# Patient Record
Sex: Male | Born: 1970 | Race: Black or African American | Hispanic: Refuse to answer | Marital: Married | Smoking: Never smoker
Health system: Southern US, Community
[De-identification: ages and names within clinical notes are randomized; demographics above are authoritative.]

## PROBLEM LIST (undated history)

## (undated) DIAGNOSIS — R3915 Urgency of urination: Secondary | ICD-10-CM

## (undated) DIAGNOSIS — M1990 Unspecified osteoarthritis, unspecified site: Secondary | ICD-10-CM

## (undated) DIAGNOSIS — T7840XA Allergy, unspecified, initial encounter: Secondary | ICD-10-CM

## (undated) DIAGNOSIS — IMO0001 Reserved for inherently not codable concepts without codable children: Secondary | ICD-10-CM

## (undated) DIAGNOSIS — E119 Type 2 diabetes mellitus without complications: Secondary | ICD-10-CM

## (undated) DIAGNOSIS — I509 Heart failure, unspecified: Secondary | ICD-10-CM

## (undated) HISTORY — PX: KNEE ARTHROSCOPY: SUR90

## (undated) HISTORY — PX: HERNIA REPAIR: SHX51

## (undated) HISTORY — PX: WRIST SURGERY: SHX841

## (undated) HISTORY — DX: Allergy, unspecified, initial encounter: T78.40XA

## (undated) HISTORY — PX: ROTATOR CUFF REPAIR: SHX139

## (undated) HISTORY — DX: Heart failure, unspecified: I50.9

---

## 2009-01-10 HISTORY — PX: GASTRIC BYPASS: SHX52

## 2013-04-08 ENCOUNTER — Emergency Department (INDEPENDENT_AMBULATORY_CARE_PROVIDER_SITE_OTHER)
Admission: EM | Admit: 2013-04-08 | Discharge: 2013-04-08 | Disposition: A | Discharge status: Home / Self Care | Payer: 59 | Source: Home / Self Care | Attending: Emergency Medicine | Admitting: Emergency Medicine

## 2013-04-08 ENCOUNTER — Encounter (HOSPITAL_COMMUNITY): Payer: Self-pay | Admitting: Emergency Medicine

## 2013-04-08 ENCOUNTER — Emergency Department (INDEPENDENT_AMBULATORY_CARE_PROVIDER_SITE_OTHER): Payer: 59

## 2013-04-08 DIAGNOSIS — J40 Bronchitis, not specified as acute or chronic: Secondary | ICD-10-CM

## 2013-04-08 DIAGNOSIS — R6889 Other general symptoms and signs: Secondary | ICD-10-CM

## 2013-04-08 HISTORY — DX: Type 2 diabetes mellitus without complications: E11.9

## 2013-04-08 LAB — POCT URINALYSIS DIP (DEVICE)
Bilirubin Urine: NEGATIVE
Glucose, UA: NEGATIVE mg/dL
Hgb urine dipstick: NEGATIVE
KETONES UR: NEGATIVE mg/dL
LEUKOCYTES UA: NEGATIVE
Nitrite: NEGATIVE
PROTEIN: 30 mg/dL — AB
Specific Gravity, Urine: >=1.03 (ref 1.005–1.030)
Urobilinogen, UA: 0.2 mg/dL (ref 0.0–1.0)
pH: 5.5 (ref 5.0–8.0)

## 2013-04-08 MED ORDER — ALBUTEROL SULFATE HFA 108 (90 BASE) MCG/ACT IN AERS: 2.0000 | INHALATION_SPRAY | RESPIRATORY_TRACT | Status: DC | PRN

## 2013-04-08 MED ORDER — PREDNISONE 50 MG PO TABS: 50.0000 mg | ORAL_TABLET | Freq: Every day | ORAL | Status: DC

## 2013-04-08 MED ORDER — HYDROCOD POLST-CHLORPHEN POLST 10-8 MG/5ML PO LQCR: 5.0000 mL | Freq: Two times a day (BID) | ORAL | Status: DC | PRN

## 2013-04-08 NOTE — ED Provider Notes (Signed)
Medical screening examination/treatment/procedure(s) were performed by non-physician practitioner and as supervising physician I was immediately available for consultation/collaboration.  Leslee Homeavid Nazarene Bunning, M.D.  Reuben Likesavid C Slyvester Latona, MD 04/08/13 [phone]

## 2013-04-08 NOTE — ED Notes (Signed)
C/o cold sx states sx started on Saturday   States he is having body aches, diarrhea, coughing, chills/hot  Did rest and took OTC medications   Denies nausea and vomiting

## 2013-04-08 NOTE — Discharge Instructions (Signed)
°Acute Bronchitis °Bronchitis is inflammation of the airways that extend from the windpipe into the lungs (bronchi). The inflammation often causes mucus to develop. This leads to a cough, which is the most common symptom of bronchitis.  °In acute bronchitis, the condition usually develops suddenly and goes away over time, usually in a couple weeks. Smoking, allergies, and asthma can make bronchitis worse. Repeated episodes of bronchitis may cause further lung problems.  °CAUSES °Acute bronchitis is most often caused by the same virus that causes a cold. The virus can spread from person to person (contagious).  °SIGNS AND SYMPTOMS  °· Cough.   °· Fever.   °· Coughing up mucus.   °· Body aches.   °· Chest congestion.   °· Chills.   °· Shortness of breath.   °· Sore throat.   °DIAGNOSIS  °Acute bronchitis is usually diagnosed through a physical exam. Tests, such as chest X-rays, are sometimes done to rule out other conditions.  °TREATMENT  °Acute bronchitis usually goes away in a couple weeks. Often times, no medical treatment is necessary. Medicines are sometimes given for relief of fever or cough. Antibiotics are usually not needed but may be prescribed in certain situations. In some cases, an inhaler may be recommended to help reduce shortness of breath and control the cough. A cool mist vaporizer may also be used to help thin bronchial secretions and make it easier to clear the chest.  °HOME CARE INSTRUCTIONS °· Get plenty of rest.   °· Drink enough fluids to keep your urine clear or pale yellow (unless you have a medical condition that requires fluid restriction). Increasing fluids may help thin your secretions and will prevent dehydration.   °· Only take over-the-counter or prescription medicines as directed by your health care provider.   °· Avoid smoking and secondhand smoke. Exposure to cigarette smoke or irritating chemicals will make bronchitis worse. If you are a smoker, consider using nicotine gum or skin  patches to help control withdrawal symptoms. Quitting smoking will help your lungs heal faster.   °· Reduce the chances of another bout of acute bronchitis by washing your hands frequently, avoiding people with cold symptoms, and trying not to touch your hands to your mouth, nose, or eyes.   °· Follow up with your health care provider as directed.   °SEEK MEDICAL CARE IF: °Your symptoms do not improve after 1 week of treatment.  °SEEK IMMEDIATE MEDICAL CARE IF: °· You develop an increased fever or chills.   °· You have chest pain.   °· You have severe shortness of breath. °· You have bloody sputum.   °· You develop dehydration. °· You develop fainting. °· You develop repeated vomiting. °· You develop a severe headache. °MAKE SURE YOU:  °· Understand these instructions. °· Will watch your condition. °· Will get help right away if you are not doing well or get worse. °Document Released: 02/04/2004 Document Revised: 08/29/2012 Document Reviewed: 06/19/2012 °ExitCare® Patient Information ©2014 [employer]. ° ° °Influenza, Adult °Influenza ("the flu") is a viral infection of the respiratory tract. It occurs more often in winter months because people spend more time in close contact with one another. Influenza can make you feel very sick. Influenza easily spreads from person to person (contagious). °CAUSES  °Influenza is caused by a virus that infects the respiratory tract. You can catch the virus by breathing in droplets from an infected person's cough or sneeze. You can also catch the virus by touching something that was recently contaminated with the virus and then touching your mouth, nose, or eyes. °SYMPTOMS  °Symptoms   typically last 4 to 10 days and may include: °· Fever. °· Chills. °· Headache, body aches, and muscle aches. °· Sore throat. °· Chest discomfort and cough. °· Poor appetite. °· Weakness or feeling tired. °· Dizziness. °· Nausea or vomiting. °DIAGNOSIS  °Diagnosis of influenza is often made based on  your history and a physical exam. A nose or throat swab test can be done to confirm the diagnosis. °RISKS AND COMPLICATIONS °You may be at risk for a more severe case of influenza if you smoke cigarettes, have diabetes, have chronic heart disease (such as heart failure) or lung disease (such as asthma), or if you have a weakened immune system. Elderly people and pregnant women are also at risk for more serious infections. The most common complication of influenza is a lung infection (pneumonia). Sometimes, this complication can require emergency medical care and may be life-threatening. °PREVENTION  °An annual influenza vaccination (flu shot) is the best way to avoid getting influenza. An annual flu shot is now routinely recommended for all adults in the [place] °TREATMENT  °In mild cases, influenza goes away on its own. Treatment is directed at relieving symptoms. For more severe cases, your caregiver may prescribe antiviral medicines to shorten the sickness. Antibiotic medicines are not effective, because the infection is caused by a virus, not by bacteria. °HOME CARE INSTRUCTIONS °· Only take over-the-counter or prescription medicines for pain, discomfort, or fever as directed by your caregiver. °· Use a cool mist humidifier to make breathing easier. °· Get plenty of rest until your temperature returns to normal. This usually takes 3 to 4 days. °· Drink enough fluids to keep your urine clear or pale yellow. °· Cover your mouth and nose when coughing or sneezing, and wash your hands well to avoid spreading the virus. °· Stay home from work or school until your fever has been gone for at least 1 full day. °SEEK MEDICAL CARE IF:  °· You have chest pain or a deep cough that worsens or produces more mucus. °· You have nausea, vomiting, or diarrhea. °SEEK IMMEDIATE MEDICAL CARE IF:  °· You have difficulty breathing, shortness of breath, or your skin or nails turn bluish. °· You have severe neck pain or stiffness. °· You  have a severe headache, facial pain, or earache. °· You have a worsening or recurring fever. °· You have nausea or vomiting that cannot be controlled. °MAKE SURE YOU: °· Understand these instructions. °· Will watch your condition. °· Will get help right away if you are not doing well or get worse. °Document Released: 12/25/1999 Document Revised: 06/28/2011 Document Reviewed: 03/28/2011 °ExitCare® Patient Information ©2014 [employer]. ° °

## 2013-04-08 NOTE — ED Provider Notes (Signed)
CSN: [id number]     Arrival date & time 04/08/13  0805 History   First MD Initiated Contact with Patient 04/08/13 [phone]     Chief Complaint  Patient presents with  . URI   (Consider location/radiation/quality/duration/timing/severity/associated sxs/prior Treatment) HPI Comments: 43 year old male presents complaining of flulike symptoms since Saturday. On Saturday, he woke up with body aches, cough, fever to 100. Yesterday, he started to have diarrhea as well. His fever is waxing and waning. He denies any sick contacts. He admits to some shortness of breath that is mild and exertional. He has not had take any diabetic medications since gastric bypass 2 years ago.  Patient is a 43 y.o. male presenting with URI.  URI Presenting symptoms: congestion, cough, fatigue, fever and sore throat   Associated symptoms: myalgias   Associated symptoms: no wheezing     Past Medical History  Diagnosis Date  . Diabetes mellitus without complication    Past Surgical History  Procedure Laterality Date  . Gastric bypass     No family history on file. History  Substance Use Topics  . Smoking status: Not on file  . Smokeless tobacco: Not on file  . Alcohol Use: Not on file    Review of Systems  Constitutional: Positive for fever, chills and fatigue.  HENT: Positive for congestion, postnasal drip and sore throat. Negative for sinus pressure.   Respiratory: Positive for cough and shortness of breath. Negative for wheezing.   Cardiovascular: Negative for chest pain and leg swelling.  Gastrointestinal: Positive for diarrhea. Negative for nausea, vomiting, abdominal pain and blood in stool.  Musculoskeletal: Positive for myalgias.  Skin: Negative for rash.  Neurological: Positive for weakness.  All other systems reviewed and are negative.    Allergies  Ace inhibitors  Home Medications   Current Outpatient Rx  Name  Route  Sig  Dispense  Refill  . albuterol (PROVENTIL HFA;VENTOLIN HFA) 108  (90 BASE) MCG/ACT inhaler   Inhalation   Inhale 2 puffs into the lungs every 4 (four) hours as needed for wheezing.   1 Inhaler   0   . chlorpheniramine-HYDROcodone (TUSSIONEX PENNKINETIC ER) 10-8 MG/5ML LQCR   Oral   Take 5 mLs by mouth every 12 (twelve) hours as needed for cough.   115 mL   0   . predniSONE (DELTASONE) 50 MG tablet   Oral   Take 1 tablet (50 mg total) by mouth daily with breakfast.   5 tablet   0    BP 121/85  Pulse 88  Temp(Src) 98.8 F (37.1 C) (Oral)  Resp 18  SpO2 97% Physical Exam  Nursing note and vitals reviewed. Constitutional: He is oriented to person, place, and time. He appears well-developed and well-nourished. No distress.  Morbidly obese habitus  HENT:  Head: Normocephalic and atraumatic.  Nose: Nose normal. Right sinus exhibits no maxillary sinus tenderness and no frontal sinus tenderness. Left sinus exhibits no maxillary sinus tenderness and no frontal sinus tenderness.  Mouth/Throat: Uvula is midline and mucous membranes are normal. Posterior oropharyngeal erythema (mild) present.  Cardiovascular: Normal rate, regular rhythm and normal heart sounds.  Exam reveals no gallop and no friction rub.   No murmur heard. Pulmonary/Chest: Effort normal and breath sounds normal. No respiratory distress. He has no wheezes. He has no rales.  Abdominal: Normal appearance. There is no tenderness. There is no CVA tenderness.  Musculoskeletal:       Lumbar back: He exhibits bony tenderness (diffuse across lumbar).  Neurological: He  is alert and oriented to person, place, and time. Coordination normal.  Skin: Skin is warm and dry. No rash noted. He is not diaphoretic.  Psychiatric: He has a normal mood and affect. Judgment normal.    ED Course  Procedures (including critical care time) Labs Review Labs Reviewed  POCT URINALYSIS DIP (DEVICE) - Abnormal; Notable for the following:    Protein, ur 30 (*)    All other components within normal limits    Imaging Review Dg Chest 2 View  04/08/2013   CLINICAL DATA:  Cough, congestion, diarrhea  EXAM: CHEST  2 VIEW  COMPARISON:  None.  FINDINGS: Cardiomediastinal silhouette is unremarkable. No acute infiltrate or pleural effusion. No pulmonary edema. Minimal degenerative changes thoracic spine.  IMPRESSION: No active cardiopulmonary disease.   Electronically Signed   By: Natasha MeadLiviu  Pop M.D.   On: 04/08/2013 09:13     MDM   1. Bronchitis   2. Influenza-like symptoms    Viral illness, probably influenza.  Past window to benefit from tamiflu.  Treating symptomatically.  F/u if no improvement in a couple of days   Meds ordered this encounter  Medications  . chlorpheniramine-HYDROcodone (TUSSIONEX PENNKINETIC ER) 10-8 MG/5ML LQCR    Sig: Take 5 mLs by mouth every 12 (twelve) hours as needed for cough.    Dispense:  115 mL    Refill:  0    Order Specific Question:  Supervising Provider    Answer:  Lorenz CoasterKELLER, DAVID C [id number]]  . albuterol (PROVENTIL HFA;VENTOLIN HFA) 108 (90 BASE) MCG/ACT inhaler    Sig: Inhale 2 puffs into the lungs every 4 (four) hours as needed for wheezing.    Dispense:  1 Inhaler    Refill:  0    Order Specific Question:  Supervising Provider    Answer:  Lorenz CoasterKELLER, DAVID C [id number]]  . predniSONE (DELTASONE) 50 MG tablet    Sig: Take 1 tablet (50 mg total) by mouth daily with breakfast.    Dispense:  5 tablet    Refill:  0    Order Specific Question:  Supervising Provider    Answer:  Reuben LikesKELLER, DAVID C [[id number]]       Graylon GoodZachary H Sabrine Patchen, PA-C 04/08/13 [phone]

## 2015-03-23 ENCOUNTER — Other Ambulatory Visit: Payer: Self-pay | Admitting: Sports Medicine

## 2015-03-23 DIAGNOSIS — M545 Low back pain: Secondary | ICD-10-CM

## 2015-03-28 ENCOUNTER — Other Ambulatory Visit: Payer: 59

## 2015-05-28 ENCOUNTER — Other Ambulatory Visit: Payer: Self-pay | Admitting: Neurosurgery

## 2015-06-12 NOTE — Pre-Procedure Instructions (Signed)
Kenneth Clark  06/12/2015     Your procedure is scheduled on Tuesday, June 23, 2015 at 11:37 AM.  Report to [facility] Admitting at 9:00 AM.  Call this number if you have problems the morning of surgery: [phone]    Remember:  Do not eat food or drink liquids after midnight.  Take these medicines the morning of surgery with A SIP OF WATER: NONE  STOP taking any Aspirin, Aleve, Naproxen, Ibuprofen, Motrin, Advil, Goody's, BC's, all herbal medications, fish oil, and all vitamins on Tuesday June 6th    How to Manage Your Diabetes Before and After Surgery  Why is it important to control my blood sugar before and after surgery? . Improving blood sugar levels before and after surgery helps healing and can limit problems. . A way of improving blood sugar control is eating a healthy diet by: o  Eating less sugar and carbohydrates o  Increasing activity/exercise o  Talking with your doctor about reaching your blood sugar goals . High blood sugars (greater than 180 mg/dL) can raise your risk of infections and slow your recovery, so you will need to focus on controlling your diabetes during the weeks before surgery. . Make sure that the doctor who takes care of your diabetes knows about your planned surgery including the date and location.  How do I manage my blood sugar before surgery? . Check your blood sugar at least 4 times a day, starting 2 days before surgery, to make sure that the level is not too high or low. o Check your blood sugar the morning of your surgery when you wake up and every 2 hours until you get to the Short Stay unit. . If your blood sugar is less than 70 mg/dL, you will need to treat for low blood sugar: o Do not take insulin. o Treat a low blood sugar (less than 70 mg/dL) with  cup of clear juice (cranberry or apple), 4 glucose tablets, OR glucose gel. o Recheck blood sugar in 15 minutes after treatment (to make sure it is greater than 70 mg/dL). If your  blood sugar is not greater than 70 mg/dL on recheck, call [phone] for further instructions. . Report your blood sugar to the short stay nurse when you get to Short Stay.  . If you are admitted to the hospital after surgery: o Your blood sugar will be checked by the staff and you will probably be given insulin after surgery (instead of oral diabetes medicines) to make sure you have good blood sugar levels. o The goal for blood sugar control after surgery is 80-180 mg/dL.     Marland Kitchen WHAT DO I DO ABOUT MY DIABETES MEDICATION?  Marland Kitchen Do not take oral diabetes medicines (pills) the morning of surgery.   Reviewed and Endorsed by [facility] Patient Education Committee, August 2015   Do not wear lotions, powders, or colonge.  You may NOT wear deodorant.  Men may shave face and neck.  Do not bring valuables to the hospital.  [facility] is not responsible for any belongings or valuables.  Contacts, dentures or bridgework may not be worn into surgery.  Leave your suitcase in the car.  After surgery it may be brought to your room.  For patients admitted to the hospital, discharge time will be determined by your treatment team.  Patients discharged the day of surgery will not be allowed to drive home.    Special instructions:  Shower using CHG soap the night  before and the morning of your surgery  Please read over the following fact sheets that you were given. Pain Booklet, Coughing and Deep Breathing, MRSA Information and Surgical Site Infection Prevention

## 2015-06-15 ENCOUNTER — Encounter (HOSPITAL_COMMUNITY): Payer: Self-pay

## 2015-06-15 ENCOUNTER — Encounter (HOSPITAL_COMMUNITY)
Admission: RE | Admit: 2015-06-15 | Discharge: 2015-06-15 | Disposition: A | Discharge status: Home / Self Care | Payer: 59 | Source: Ambulatory Visit | Attending: Neurosurgery | Admitting: Neurosurgery

## 2015-06-15 DIAGNOSIS — Z01812 Encounter for preprocedural laboratory examination: Secondary | ICD-10-CM | POA: Diagnosis not present

## 2015-06-15 DIAGNOSIS — M5126 Other intervertebral disc displacement, lumbar region: Secondary | ICD-10-CM | POA: Insufficient documentation

## 2015-06-15 HISTORY — DX: Urgency of urination: R39.15

## 2015-06-15 HISTORY — DX: Unspecified osteoarthritis, unspecified site: M19.90

## 2015-06-15 HISTORY — DX: Reserved for inherently not codable concepts without codable children: IMO0001

## 2015-06-15 LAB — BASIC METABOLIC PANEL
Anion gap: 6 (ref 5–15)
BUN: 8 mg/dL (ref 6–20)
CHLORIDE: 109 mmol/L (ref 101–111)
CO2: 24 mmol/L (ref 22–32)
CREATININE: 0.7 mg/dL (ref 0.61–1.24)
Calcium: 8.9 mg/dL (ref 8.9–10.3)
GFR calc Af Amer: >60 mL/min (ref >60)
GFR calc non Af Amer: >60 mL/min (ref >60)
GLUCOSE: 110 mg/dL — AB (ref 65–99)
POTASSIUM: 4.1 mmol/L (ref 3.5–5.1)
Sodium: 139 mmol/L (ref 135–145)

## 2015-06-15 LAB — SURGICAL PCR SCREEN
MRSA, PCR: NEGATIVE
Staphylococcus aureus: NEGATIVE

## 2015-06-15 LAB — CBC
HEMATOCRIT: 42.9 % (ref 39.0–52.0)
HEMOGLOBIN: 13.6 g/dL (ref 13.0–17.0)
MCH: 24.8 pg — AB (ref 26.0–34.0)
MCHC: 31.7 g/dL (ref 30.0–36.0)
MCV: 78.1 fL (ref 78.0–100.0)
Platelets: 184 10*3/uL (ref 150–400)
RBC: 5.49 MIL/uL (ref 4.22–5.81)
RDW: 13.4 % (ref 11.5–15.5)
WBC: 6.3 10*3/uL (ref 4.0–10.5)

## 2015-06-15 LAB — GLUCOSE, CAPILLARY: Glucose-Capillary: 164 mg/dL — ABNORMAL HIGH (ref 65–99)

## 2015-06-15 NOTE — Progress Notes (Signed)
PCP is [facility]  Patient denied having any acute cardiac issues, but informed Nurse that he developed a cough approximately three or four weeks ago. Patient denied having any fever or chills, and stated that he is "not coughing up any sputum". Patient stated "I feel fine now."  CBG on arrival to PAT was 164. Patient stated he consumed four cups of black coffee, but has not consumed any food this morning.

## 2015-06-16 LAB — HEMOGLOBIN A1C
Hgb A1c MFr Bld: 6.5 % — ABNORMAL HIGH (ref 4.8–5.6)
Mean Plasma Glucose: 140 mg/dL

## 2015-06-22 MED ORDER — DEXTROSE 5 % IV SOLN
3.0000 g | INTRAVENOUS | Status: AC
  Administered 2015-06-23: 3 g via INTRAVENOUS
  Filled 2015-06-22: qty 3000
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
## 2015-06-23 ENCOUNTER — Ambulatory Visit (HOSPITAL_COMMUNITY)
Admission: RE | Admit: 2015-06-23 | Discharge: 2015-06-25 | DRG: 516 | Disposition: A | Discharge status: Home / Self Care | Payer: 59 | Source: Ambulatory Visit | Attending: Neurosurgery | Admitting: Neurosurgery

## 2015-06-23 ENCOUNTER — Encounter (HOSPITAL_COMMUNITY): Payer: Self-pay | Admitting: *Deleted

## 2015-06-23 ENCOUNTER — Ambulatory Visit (HOSPITAL_COMMUNITY): Payer: 59 | Admitting: Certified Registered Nurse Anesthetist

## 2015-06-23 ENCOUNTER — Ambulatory Visit (HOSPITAL_COMMUNITY): Payer: 59

## 2015-06-23 ENCOUNTER — Encounter (HOSPITAL_COMMUNITY)
Admission: RE | Disposition: A | Discharge status: Home / Self Care | Payer: Self-pay | Source: Ambulatory Visit | Attending: Neurosurgery

## 2015-06-23 DIAGNOSIS — M4807 Spinal stenosis, lumbosacral region: Secondary | ICD-10-CM | POA: Diagnosis not present

## 2015-06-23 DIAGNOSIS — E119 Type 2 diabetes mellitus without complications: Secondary | ICD-10-CM | POA: Diagnosis not present

## 2015-06-23 DIAGNOSIS — Z419 Encounter for procedure for purposes other than remedying health state, unspecified: Secondary | ICD-10-CM

## 2015-06-23 DIAGNOSIS — M5116 Intervertebral disc disorders with radiculopathy, lumbar region: Secondary | ICD-10-CM | POA: Insufficient documentation

## 2015-06-23 DIAGNOSIS — Z9884 Bariatric surgery status: Secondary | ICD-10-CM | POA: Diagnosis not present

## 2015-06-23 DIAGNOSIS — M545 Low back pain: Secondary | ICD-10-CM | POA: Diagnosis present

## 2015-06-23 DIAGNOSIS — Z6841 Body Mass Index (BMI) 40.0 and over, adult: Secondary | ICD-10-CM | POA: Insufficient documentation

## 2015-06-23 DIAGNOSIS — M48061 Spinal stenosis, lumbar region without neurogenic claudication: Secondary | ICD-10-CM | POA: Diagnosis present

## 2015-06-23 DIAGNOSIS — M4806 Spinal stenosis, lumbar region: Secondary | ICD-10-CM | POA: Insufficient documentation

## 2015-06-23 HISTORY — PX: LUMBAR LAMINECTOMY/DECOMPRESSION MICRODISCECTOMY: SHX5026

## 2015-06-23 LAB — GLUCOSE, CAPILLARY
GLUCOSE-CAPILLARY: 104 mg/dL — AB (ref 65–99)
GLUCOSE-CAPILLARY: 121 mg/dL — AB (ref 65–99)

## 2015-06-23 SURGERY — LUMBAR LAMINECTOMY/DECOMPRESSION MICRODISCECTOMY 2 LEVELS
Anesthesia: General | Laterality: Right

## 2015-06-23 MED ORDER — HEMOSTATIC AGENTS (NO CHARGE) OPTIME
TOPICAL | Status: DC | PRN
  Administered 2015-06-23: 1 via TOPICAL

## 2015-06-23 MED ORDER — SODIUM CHLORIDE 0.9% FLUSH: 3.0000 mL | INTRAVENOUS | Status: DC | PRN

## 2015-06-23 MED ORDER — LACTATED RINGERS IV SOLN
INTRAVENOUS | Status: DC | PRN
  Administered 2015-06-23 (×2): via INTRAVENOUS

## 2015-06-23 MED ORDER — HYDROMORPHONE HCL 1 MG/ML IJ SOLN
0.2500 mg | INTRAMUSCULAR | Status: DC | PRN
  Administered 2015-06-23 (×2): 0.5 mg via INTRAVENOUS

## 2015-06-23 MED ORDER — FENTANYL CITRATE (PF) 100 MCG/2ML IJ SOLN
INTRAMUSCULAR | Status: AC
  Filled 2015-06-23: qty 2

## 2015-06-23 MED ORDER — SENNA 8.6 MG PO TABS
1.0000 | ORAL_TABLET | Freq: Two times a day (BID) | ORAL | Status: DC
  Administered 2015-06-23 – 2015-06-24 (×3): 8.6 mg via ORAL
  Filled 2015-06-23 (×3): qty 1

## 2015-06-23 MED ORDER — OXYCODONE-ACETAMINOPHEN 5-325 MG PO TABS
1.0000 | ORAL_TABLET | ORAL | Status: DC | PRN
  Administered 2015-06-24 – 2015-06-25 (×4): 1 via ORAL
  Filled 2015-06-23 (×4): qty 1

## 2015-06-23 MED ORDER — METHYLPREDNISOLONE ACETATE 80 MG/ML IJ SUSP
INTRAMUSCULAR | Status: DC | PRN
  Administered 2015-06-23: 80 mg

## 2015-06-23 MED ORDER — ONDANSETRON HCL 4 MG/2ML IJ SOLN
INTRAMUSCULAR | Status: DC | PRN
  Administered 2015-06-23: 4 mg via INTRAVENOUS

## 2015-06-23 MED ORDER — ARTIFICIAL TEARS OP OINT
TOPICAL_OINTMENT | OPHTHALMIC | Status: AC
  Filled 2015-06-23: qty 3.5

## 2015-06-23 MED ORDER — MENTHOL 3 MG MT LOZG: 1.0000 | LOZENGE | OROMUCOSAL | Status: DC | PRN

## 2015-06-23 MED ORDER — FENTANYL CITRATE (PF) 250 MCG/5ML IJ SOLN
INTRAMUSCULAR | Status: AC
  Filled 2015-06-23: qty 5

## 2015-06-23 MED ORDER — ROCURONIUM BROMIDE 50 MG/5ML IV SOLN
INTRAVENOUS | Status: AC
  Filled 2015-06-23: qty 1

## 2015-06-23 MED ORDER — VANCOMYCIN HCL 1000 MG IV SOLR
INTRAVENOUS | Status: AC
  Filled 2015-06-23: qty 1000

## 2015-06-23 MED ORDER — HYDROMORPHONE HCL 1 MG/ML IJ SOLN
INTRAMUSCULAR | Status: AC
  Filled 2015-06-23: qty 1

## 2015-06-23 MED ORDER — KETOROLAC TROMETHAMINE 0.5 % OP SOLN: 1.0000 [drp] | Freq: Once | OPHTHALMIC | Status: DC

## 2015-06-23 MED ORDER — ONDANSETRON HCL 4 MG/2ML IJ SOLN
INTRAMUSCULAR | Status: AC
  Filled 2015-06-23: qty 2

## 2015-06-23 MED ORDER — THROMBIN 5000 UNITS EX SOLR
CUTANEOUS | Status: DC | PRN
  Administered 2015-06-23: 12:00:00 via TOPICAL

## 2015-06-23 MED ORDER — BSS IO SOLN: 15.0000 mL | Freq: Once | INTRAOCULAR | Status: DC

## 2015-06-23 MED ORDER — FENTANYL CITRATE (PF) 100 MCG/2ML IJ SOLN
INTRAMUSCULAR | Status: DC | PRN
  Administered 2015-06-23 (×7): 50 ug via INTRAVENOUS

## 2015-06-23 MED ORDER — THROMBIN 5000 UNITS EX SOLR
CUTANEOUS | Status: DC | PRN
  Administered 2015-06-23 (×2): 5000 [IU] via TOPICAL

## 2015-06-23 MED ORDER — VECURONIUM BROMIDE 10 MG IV SOLR
INTRAVENOUS | Status: AC
  Filled 2015-06-23: qty 10

## 2015-06-23 MED ORDER — SODIUM CHLORIDE 0.9 % IV SOLN: 250.0000 mL | INTRAVENOUS | Status: DC

## 2015-06-23 MED ORDER — SODIUM CHLORIDE 0.9 % IV SOLN
INTRAVENOUS | Status: DC
  Administered 2015-06-23: 17:00:00 via INTRAVENOUS

## 2015-06-23 MED ORDER — ACETAMINOPHEN 650 MG RE SUPP: 650.0000 mg | RECTAL | Status: DC | PRN

## 2015-06-23 MED ORDER — PROPOFOL 10 MG/ML IV BOLUS
INTRAVENOUS | Status: DC | PRN
  Administered 2015-06-23: 200 mg via INTRAVENOUS
  Administered 2015-06-23: 70 mg via INTRAVENOUS

## 2015-06-23 MED ORDER — LACTATED RINGERS IV SOLN
Freq: Once | INTRAVENOUS | Status: AC
  Administered 2015-06-23: 10:00:00 via INTRAVENOUS

## 2015-06-23 MED ORDER — MIDAZOLAM HCL 2 MG/2ML IJ SOLN
INTRAMUSCULAR | Status: AC
  Filled 2015-06-23: qty 2

## 2015-06-23 MED ORDER — VANCOMYCIN HCL 1000 MG IV SOLR
INTRAVENOUS | Status: DC | PRN
  Administered 2015-06-23 (×2): 1000 mg

## 2015-06-23 MED ORDER — PROPOFOL 10 MG/ML IV BOLUS
INTRAVENOUS | Status: AC
  Filled 2015-06-23: qty 20

## 2015-06-23 MED ORDER — 0.9 % SODIUM CHLORIDE (POUR BTL) OPTIME
TOPICAL | Status: DC | PRN
  Administered 2015-06-23: 1000 mL

## 2015-06-23 MED ORDER — PHENOL 1.4 % MT LIQD: 1.0000 | OROMUCOSAL | Status: DC | PRN

## 2015-06-23 MED ORDER — NAPHAZOLINE-GLYCERIN 0.012-0.2 % OP SOLN
1.0000 [drp] | Freq: Four times a day (QID) | OPHTHALMIC | Status: DC | PRN
  Administered 2015-06-23: 2 [drp] via OPHTHALMIC
  Filled 2015-06-23: qty 15

## 2015-06-23 MED ORDER — DIAZEPAM 5 MG PO TABS: 10.0000 mg | ORAL_TABLET | Freq: Four times a day (QID) | ORAL | Status: DC | PRN

## 2015-06-23 MED ORDER — ONDANSETRON HCL 4 MG/2ML IJ SOLN: 4.0000 mg | INTRAMUSCULAR | Status: DC | PRN

## 2015-06-23 MED ORDER — MORPHINE SULFATE (PF) 2 MG/ML IV SOLN: 1.0000 mg | INTRAVENOUS | Status: DC | PRN

## 2015-06-23 MED ORDER — SODIUM CHLORIDE 0.9% FLUSH
3.0000 mL | Freq: Two times a day (BID) | INTRAVENOUS | Status: DC
  Administered 2015-06-24 – 2015-06-25 (×2): 3 mL via INTRAVENOUS

## 2015-06-23 MED ORDER — MIDAZOLAM HCL 5 MG/5ML IJ SOLN
INTRAMUSCULAR | Status: DC | PRN
  Administered 2015-06-23 (×2): 2 mg via INTRAVENOUS

## 2015-06-23 MED ORDER — ALBUTEROL SULFATE HFA 108 (90 BASE) MCG/ACT IN AERS
INHALATION_SPRAY | RESPIRATORY_TRACT | Status: AC
  Filled 2015-06-23: qty 6.7

## 2015-06-23 MED ORDER — PROMETHAZINE HCL 25 MG/ML IJ SOLN: 6.2500 mg | INTRAMUSCULAR | Status: DC | PRN

## 2015-06-23 MED ORDER — ROCURONIUM BROMIDE 100 MG/10ML IV SOLN
INTRAVENOUS | Status: DC | PRN
  Administered 2015-06-23 (×2): 50 mg via INTRAVENOUS

## 2015-06-23 MED ORDER — MEPERIDINE HCL 25 MG/ML IJ SOLN: 6.2500 mg | INTRAMUSCULAR | Status: DC | PRN

## 2015-06-23 MED ORDER — CEFAZOLIN SODIUM 1-5 GM-% IV SOLN
1.0000 g | Freq: Three times a day (TID) | INTRAVENOUS | Status: AC
  Administered 2015-06-23 – 2015-06-24 (×2): 1 g via INTRAVENOUS
  Filled 2015-06-23 (×2): qty 50

## 2015-06-23 MED ORDER — KETOROLAC TROMETHAMINE 30 MG/ML IJ SOLN: 30.0000 mg | Freq: Once | INTRAMUSCULAR | Status: DC

## 2015-06-23 MED ORDER — SUGAMMADEX SODIUM 500 MG/5ML IV SOLN
INTRAVENOUS | Status: DC | PRN
  Administered 2015-06-23: 400 mg via INTRAVENOUS
  Administered 2015-06-23: 100 mg via INTRAVENOUS

## 2015-06-23 MED ORDER — ACETAMINOPHEN 325 MG PO TABS: 650.0000 mg | ORAL_TABLET | ORAL | Status: DC | PRN

## 2015-06-23 MED FILL — Sodium Chloride IV Soln 0.9%: INTRAVENOUS | Qty: 1000 | Status: AC

## 2015-06-23 MED FILL — Heparin Sodium (Porcine) Inj 1000 Unit/ML: INTRAMUSCULAR | Qty: 30 | Status: AC

## 2015-06-23 SURGICAL SUPPLY — 50 items
BENZOIN TINCTURE PRP APPL 2/3 (GAUZE/BANDAGES/DRESSINGS) ×3 IMPLANT
BLADE CLIPPER SURG (BLADE) IMPLANT
BUR ACORN 6.0 (BURR) IMPLANT
BUR ACORN 6.0MM (BURR)
BUR MATCHSTICK NEURO 3.0 LAGG (BURR) ×3 IMPLANT
CANISTER SUCT 3000ML PPV (MISCELLANEOUS) ×3 IMPLANT
CLOSURE WOUND 1/2 X4 (GAUZE/BANDAGES/DRESSINGS) ×1
DRAPE LAPAROTOMY 100X72X124 (DRAPES) ×3 IMPLANT
DRAPE MICROSCOPE LEICA (MISCELLANEOUS) ×3 IMPLANT
DRAPE POUCH INSTRU U-SHP 10X18 (DRAPES) ×3 IMPLANT
DRAPE PROXIMA HALF (DRAPES) ×3 IMPLANT
DRSG OPSITE POSTOP 4X6 (GAUZE/BANDAGES/DRESSINGS) ×3 IMPLANT
DRSG PAD ABDOMINAL 8X10 ST (GAUZE/BANDAGES/DRESSINGS) IMPLANT
DURAPREP 26ML APPLICATOR (WOUND CARE) ×3 IMPLANT
ELECT REM PT RETURN 9FT ADLT (ELECTROSURGICAL) ×3
ELECTRODE REM PT RTRN 9FT ADLT (ELECTROSURGICAL) ×1 IMPLANT
GAUZE SPONGE 4X4 12PLY STRL (GAUZE/BANDAGES/DRESSINGS) ×3 IMPLANT
GAUZE SPONGE 4X4 16PLY XRAY LF (GAUZE/BANDAGES/DRESSINGS) ×6 IMPLANT
GLOVE BIOGEL M 8.0 STRL (GLOVE) ×3 IMPLANT
GLOVE EXAM NITRILE LRG STRL (GLOVE) IMPLANT
GLOVE EXAM NITRILE MD LF STRL (GLOVE) IMPLANT
GLOVE EXAM NITRILE XL STR (GLOVE) IMPLANT
GLOVE EXAM NITRILE XS STR PU (GLOVE) IMPLANT
GOWN STRL REUS W/ TWL LRG LVL3 (GOWN DISPOSABLE) ×1 IMPLANT
GOWN STRL REUS W/ TWL XL LVL3 (GOWN DISPOSABLE) IMPLANT
GOWN STRL REUS W/TWL 2XL LVL3 (GOWN DISPOSABLE) IMPLANT
GOWN STRL REUS W/TWL LRG LVL3 (GOWN DISPOSABLE) ×2
GOWN STRL REUS W/TWL XL LVL3 (GOWN DISPOSABLE)
KIT BASIN OR (CUSTOM PROCEDURE TRAY) ×3 IMPLANT
KIT ROOM TURNOVER OR (KITS) ×3 IMPLANT
NEEDLE HYPO 18GX1.5 BLUNT FILL (NEEDLE) IMPLANT
NEEDLE HYPO 21X1.5 SAFETY (NEEDLE) IMPLANT
NEEDLE HYPO 25X1 1.5 SAFETY (NEEDLE) IMPLANT
NEEDLE SPNL 20GX3.5 QUINCKE YW (NEEDLE) ×3 IMPLANT
NS IRRIG 1000ML POUR BTL (IV SOLUTION) ×3 IMPLANT
PACK LAMINECTOMY NEURO (CUSTOM PROCEDURE TRAY) ×3 IMPLANT
PAD ARMBOARD 7.5X6 YLW CONV (MISCELLANEOUS) ×9 IMPLANT
PATTIES SURGICAL .5 X1 (DISPOSABLE) ×3 IMPLANT
RUBBERBAND STERILE (MISCELLANEOUS) ×6 IMPLANT
SPONGE LAP 4X18 X RAY DECT (DISPOSABLE) IMPLANT
SPONGE SURGIFOAM ABS GEL SZ50 (HEMOSTASIS) ×3 IMPLANT
STRIP CLOSURE SKIN 1/2X4 (GAUZE/BANDAGES/DRESSINGS) ×2 IMPLANT
SUT VIC AB 0 CT1 18XCR BRD8 (SUTURE) ×1 IMPLANT
SUT VIC AB 0 CT1 8-18 (SUTURE) ×2
SUT VIC AB 2-0 CP2 18 (SUTURE) ×3 IMPLANT
SUT VIC AB 3-0 SH 8-18 (SUTURE) ×3 IMPLANT
SYR 5ML LL (SYRINGE) IMPLANT
TOWEL OR 17X24 6PK STRL BLUE (TOWEL DISPOSABLE) ×3 IMPLANT
TOWEL OR 17X26 10 PK STRL BLUE (TOWEL DISPOSABLE) ×3 IMPLANT
WATER STERILE IRR 1000ML POUR (IV SOLUTION) ×3 IMPLANT

## 2015-06-23 NOTE — OR Nursing (Signed)
Fentanyl 13800mcg/2ml given by Dr Jeral FruitBotero to operative site

## 2015-06-23 NOTE — Anesthesia Procedure Notes (Signed)
Procedure Name: Intubation Date/Time: 06/23/2015 11:39 AM Performed by: Margaree MackintoshYACOUB, Tiffiney Sparrow B Pre-anesthesia Checklist: Patient identified, Emergency Drugs available, Suction available, Patient being monitored and Timeout performed Patient Re-evaluated:Patient Re-evaluated prior to inductionOxygen Delivery Method: Circle system utilized Preoxygenation: Pre-oxygenation with 100% oxygen Intubation Type: IV induction Ventilation: Mask ventilation without difficulty and Oral airway inserted - appropriate to patient size Laryngoscope Size: Mac and 4 Grade View: Grade I Tube type: Oral Tube size: 7.0 mm Number of attempts: 1 Airway Equipment and Method: Stylet Placement Confirmation: ETT inserted through vocal cords under direct vision,  positive ETCO2 and breath sounds checked- equal and bilateral Secured at: 23 cm Tube secured with: Tape Dental Injury: Teeth and Oropharynx as per pre-operative assessment

## 2015-06-23 NOTE — Anesthesia Preprocedure Evaluation (Signed)
Anesthesia Evaluation  Patient identified by MRN, date of birth, ID band Patient awake    Reviewed: Allergy & Precautions, H&P , Patient's Chart, lab work & pertinent test results, reviewed documented beta blocker date and time   Airway Mallampati: I  TM Distance: >3 FB Neck ROM: full    Dental no notable dental hx. (+) Teeth Intact   Pulmonary    Pulmonary exam normal        Cardiovascular negative cardio ROS Normal cardiovascular exam     Neuro/Psych negative neurological ROS  negative psych ROS   GI/Hepatic negative GI ROS, Neg liver ROS,   Endo/Other  diabetes, Type obesity  Renal/GU negative Renal ROS     Musculoskeletal   Abdominal (+) + obese,   Peds  Hematology negative hematology ROS (+)   Anesthesia Other Findings   Reproductive/Obstetrics negative OB ROS                             Anesthesia Physical Anesthesia Plan  ASA: III  Anesthesia Plan: General   Post-op Pain Management:    Induction: Intravenous  Airway Management Planned: Oral ETT  Additional Equipment:   Intra-op Plan:   Post-operative Plan: Extubation in OR  Informed Consent: I have reviewed the patients History and Physical, chart, labs and discussed the procedure including the risks, benefits and alternatives for the proposed anesthesia with the patient or authorized representative who has indicated his/her understanding and acceptance.   Dental Advisory Given  Plan Discussed with: CRNA and Surgeon  Anesthesia Plan Comments:         Anesthesia Quick Evaluation

## 2015-06-23 NOTE — Anesthesia Postprocedure Evaluation (Addendum)
Anesthesia Post Note  Patient: Kenneth Heinricharree Amend Jr.  Procedure(s) Performed: Procedure(s) (LRB): Right Lumbar four-five Lumbar five-Sacral one Discectomy/Foraminotomy (Right)  Patient location during evaluation: PACU Anesthesia Type: General Level of consciousness: awake Pain management: pain level controlled Vital Signs Assessment: post-procedure vital signs reviewed and stable Respiratory status: spontaneous breathing Cardiovascular status: stable Postop Assessment: no signs of nausea or vomiting Anesthetic complications: no Comments: Possible L corneal abrasion. Placed on corneal abrasion protocol.     Last Vitals:  Filed Vitals:   06/23/15 1500 06/23/15 1530  BP:    Pulse: 65   Temp:    Resp: 18 19    Last Pain:  Filed Vitals:   06/23/15 1533  PainSc: 6    Pain Goal: Patients Stated Pain Goal: 5 (06/23/15 1004)               Carmell Elgin JR,JOHN Susann GivensFRANKLIN

## 2015-06-23 NOTE — Transfer of Care (Signed)
Immediate Anesthesia Transfer of Care Note  Patient: Kenneth Heinricharree Raether Jr.  Procedure(s) Performed: Procedure(s): Right Lumbar four-five Lumbar five-Sacral one Discectomy/Foraminotomy (Right)  Patient Location: PACU  Anesthesia Type:General  Level of Consciousness: awake, alert  and oriented  Airway & Oxygen Therapy: Patient Spontanous Breathing and Patient connected to nasal cannula oxygen  Post-op Assessment: Report given to RN and Post -op Vital signs reviewed and stable  Post vital signs: Reviewed and stable  Last Vitals:  Filed Vitals:   06/23/15 0941 06/23/15 1402  BP: 124/75   Pulse: 75 77  Temp: 37 C 36 C  Resp: 20     Last Pain:  Filed Vitals:   06/23/15 1408  PainSc: 8       Patients Stated Pain Goal: 5 (06/23/15 1004)  Complications: No apparent anesthesia complications

## 2015-06-24 ENCOUNTER — Encounter (HOSPITAL_COMMUNITY): Payer: Self-pay | Admitting: Neurosurgery

## 2015-06-24 DIAGNOSIS — M5116 Intervertebral disc disorders with radiculopathy, lumbar region: Secondary | ICD-10-CM | POA: Diagnosis not present

## 2015-06-24 NOTE — Evaluation (Signed)
Occupational Therapy Evaluation and Discharge Patient Details Name: Kenneth Heinricharree Dady Jr. MRN: [id number] DOB: 13-May-1970 Today's Date: 06/24/2015    History of Present Illness Patient is a 45 y/o male with hx of DM presents s/p Right Lumbar four-five Lumbar five-Sacral one Discectomy/Foraminotomy.   Clinical Impression   Pt reports he was independent with ADLs and mobility PTA. Currently pt overall supervision for safety with functional mobility and ADLs with the exception of mod assist for LB ADLs; family to assist as needed. All back, safety, and ADL education completed with pt. Pt planning to d/c home with 24/7 supervision from family. No further acute OT needs identified; signing off at this time. Please re-consult if needs change. Thank you for this referral.    Follow Up Recommendations  No OT follow up;Supervision - Intermittent    Equipment Recommendations  3 in 1 bedside comode (bari 3 in 1)    Recommendations for Other Services       Precautions / Restrictions Precautions Precautions: Back Precaution Booklet Issued: No Precaution Comments: Educated pt on back precautions. Pt able to recall 2/3 at end of session. Restrictions Weight Bearing Restrictions: No      Mobility Bed Mobility Overal bed mobility: Needs Assistance Bed Mobility: Rolling;Sidelying to Sit Rolling: Modified independent (Device/Increase time) Sidelying to sit: Modified independent (Device/Increase time)       General bed mobility comments: HOB flat, no use of rails to simulate home. Cues for log roll technique.  Transfers Overall transfer level: Needs assistance Equipment used: None Transfers: Sit to/from Stand Sit to Stand: Supervision         General transfer comment: Supervision for safety; no physical assist required.    Balance Overall balance assessment: Needs assistance Sitting-balance support: Feet supported;No upper extremity supported Sitting balance-Leahy Scale: Good      Standing balance support: No upper extremity supported;During functional activity Standing balance-Leahy Scale: Good                              ADL Overall ADL's : Needs assistance/impaired Eating/Feeding: Independent;Sitting   Grooming: Supervision/safety;Standing Grooming Details (indicate cue type and reason): Educated pt on use of 2 cups for oral care Upper Body Bathing: Set up;Sitting   Lower Body Bathing: Moderate assistance;Sit to/from stand   Upper Body Dressing : Set up;Sitting   Lower Body Dressing: Moderate assistance;Sit to/from stand Lower Body Dressing Details (indicate cue type and reason): Pt reports wife or children can assist with LB ADLs initially; no need for AE. Toilet Transfer: Supervision/safety;Ambulation;BSC Toilet Transfer Details (indicate cue type and reason): Simulated by sit to stand from EOB Toileting- Clothing Manipulation and Hygiene: Minimal assistance;Sit to/from stand   Tub/ Shower Transfer: Supervision/safety;Tub transfer;Ambulation   Functional mobility during ADLs: Supervision/safety General ADL Comments: Educated pt on maintaining back precautions during functional activities, bed mobility technique, no reaching or stooping, keeping frequently used items at countertop height.     Vision Vision Assessment?: No apparent visual deficits   Perception     Praxis      Pertinent Vitals/Pain Pain Assessment: 0-10 Pain Score: 6  Pain Location: back, surgical site Pain Descriptors / Indicators: Sore Pain Intervention(s): Monitored during session;Repositioned;Premedicated before session     Hand Dominance     Extremity/Trunk Assessment Upper Extremity Assessment Upper Extremity Assessment: Overall WFL for tasks assessed   Lower Extremity Assessment Lower Extremity Assessment: Defer to PT evaluation   Cervical / Trunk Assessment Cervical / Trunk Assessment:  Other exceptions Cervical / Trunk Exceptions: morbid obesity,  s/p lumbar sx   Communication Communication Communication: No difficulties   Cognition Arousal/Alertness: Awake/alert Behavior During Therapy: WFL for tasks assessed/performed Overall Cognitive Status: Within Functional Limits for tasks assessed                     General Comments       Exercises       Shoulder Instructions      Home Living Family/patient expects to be discharged to:: Private residence Living Arrangements: Spouse/significant other;Children Available Help at Discharge: Family;Available 24 hours/day Type of Home: House Home Access: Level entry     Home Layout: Two level;Bed/bath upstairs Alternate Level Stairs-Number of Steps: 1 flight Alternate Level Stairs-Rails: Right Bathroom Shower/Tub: Tub/shower unit Shower/tub characteristics: Engineer, building services: Standard     Home Equipment: None          Prior Functioning/Environment Level of Independence: Independent        Comments: Driving, 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    OT Diagnosis: Acute pain;Generalized weakness   OT Problem List:     OT Treatment/Interventions:      OT Goals(Current goals can be found in the care plan section) Acute Rehab OT Goals Patient Stated Goal: to go home today OT Goal Formulation: All assessment and education complete, DC therapy  OT Frequency:     Barriers to D/C:            Co-evaluation PT/OT/SLP Co-Evaluation/Treatment: Yes Reason for Co-Treatment: For patient/therapist safety;Other (comment) (pt wanting to d/c today) PT goals addressed during session: Mobility/safety with mobility;Balance OT goals addressed during session: ADL's and self-care;Other (comment) (functional mobility)      End of Session    Activity Tolerance: Patient tolerated treatment well Patient left: in chair;with call bell/phone within reach   Time: 8119-1478 OT Time Calculation (min): 31 min Charges:  OT General Charges $OT Visit: 1  Procedure OT Evaluation $OT Eval Moderate Complexity: 1 Procedure G-Codes:     Gaye Alken M.S., OTR/L Pager: [phone]  06/24/2015, 3:14 PM

## 2015-06-24 NOTE — Progress Notes (Signed)
Pt ambulated to the bathroom and hemovac came out bleeding noted applied dressing. Pt denied pain or discomfort. Dressing changed dry and intact at this time. 50ml emptied. Will continue to monitor.

## 2015-06-24 NOTE — Evaluation (Signed)
Physical Therapy Evaluation Patient Details Name: Kenneth Heinricharree Roehm Jr. MRN: [id number] DOB: Aug 30, 1970 Today's Date: 06/24/2015   History of Present Illness  Patient is a 45 y/o male with hx of DM presents s/p Right Lumbar four-five Lumbar five-Sacral one Discectomy/Foraminotomy.  Clinical Impression  Patient presents with pain and post surgical deficits s/p above surgery impacting mobility. Tolerated transfers and ambulation Mod I for safety. Pt will have support from children and wife at home. Reviewed back precautions and positioning. Will follow up tomorrow for stair training as pt has to negotiate 1 flight of steps to get to bedroom.     Follow Up Recommendations No PT follow up;Supervision - Intermittent    Equipment Recommendations  None recommended by PT    Recommendations for Other Services       Precautions / Restrictions Precautions Precautions: Back Precaution Booklet Issued: No Precaution Comments: Reviewed back precautions Restrictions Weight Bearing Restrictions: No      Mobility  Bed Mobility Overal bed mobility: Needs Assistance Bed Mobility: Rolling;Sidelying to Sit Rolling: Modified independent (Device/Increase time) Sidelying to sit: Modified independent (Device/Increase time)       General bed mobility comments: HOB flat, no use of rails to simulate home. Cues for log roll technique.  Transfers Overall transfer level: Needs assistance Equipment used: Rolling walker (2 wheeled) (bari RW) Transfers: Sit to/from Stand           General transfer comment: Stood from EOB x1. No assist needed.  Ambulation/Gait Ambulation/Gait assistance: Modified independent (Device/Increase time) Ambulation Distance (Feet): 250 Feet Assistive device: None Gait Pattern/deviations: Step-through pattern;Decreased stride length Gait velocity: decreased   General Gait Details: Steady gait. Mild DOE. 2 short standing rest breaks.  Stairs Stairs: Yes Stairs  assistance: Modified independent (Device/Increase time) Stair Management: Two rails;Alternating pattern Number of Stairs: 3 (+ 2 steps x2 bouts)    Wheelchair Mobility    Modified Rankin (Stroke Patients Only)       Balance Overall balance assessment: Needs assistance Sitting-balance support: Feet supported;No upper extremity supported Sitting balance-Leahy Scale: Good     Standing balance support: During functional activity Standing balance-Leahy Scale: Good                               Pertinent Vitals/Pain Pain Assessment: 0-10 Pain Score: 6  Pain Location: back at surgical site Pain Descriptors / Indicators: Sore;Aching Pain Intervention(s): Monitored during session;Repositioned;Premedicated before session    Home Living Family/patient expects to be discharged to:: Private residence Living Arrangements: Spouse/significant other;Children Available Help at Discharge: Family;Available 24 hours/day Type of Home: House Home Access: Level entry     Home Layout: Two level;Bed/bath upstairs Home Equipment: None      Prior Function Level of Independence: Independent         Comments: Driving, 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     Hand Dominance        Extremity/Trunk Assessment   Upper Extremity Assessment: Defer to OT evaluation           Lower Extremity Assessment: Generalized weakness         Communication   Communication: No difficulties  Cognition Arousal/Alertness: Awake/alert Behavior During Therapy: WFL for tasks assessed/performed Overall Cognitive Status: Within Functional Limits for tasks assessed                      General Comments      Exercises  Assessment/Plan    PT Assessment Patient needs continued PT services  PT Diagnosis Difficulty walking;Acute pain   PT Problem List Decreased strength;Pain;Cardiopulmonary status limiting activity;Decreased balance;Decreased  mobility;Decreased knowledge of precautions;Decreased activity tolerance  PT Treatment Interventions Balance training;Gait training;Functional mobility training;Therapeutic activities;Therapeutic exercise;Patient/family education;Stair training   PT Goals (Current goals can be found in the Care Plan section) Acute Rehab PT Goals Patient Stated Goal: to go home today PT Goal Formulation: With patient Time For Goal Achievement: 07/08/15 Potential to Achieve Goals: Good    Frequency Min 5X/week   Barriers to discharge        Co-evaluation PT/OT/SLP Co-Evaluation/Treatment: Yes Reason for Co-Treatment: For patient/therapist safety (wanting to discharge today) PT goals addressed during session: Mobility/safety with mobility;Balance         End of Session   Activity Tolerance: Patient tolerated treatment well Patient left: in chair;with call bell/phone within reach Nurse Communication: Mobility status         Time: 1610-9604 PT Time Calculation (min) (ACUTE ONLY): 33 min   Charges:   PT Evaluation $PT Eval Moderate Complexity: 1 Procedure     PT G Codes:        Amilia Vandenbrink A Dodi Leu 06/24/2015, 1:49 PM Mylo Red, PT, DPT [phone]

## 2015-06-24 NOTE — Progress Notes (Signed)
Patient ID: Kenneth HeinrichParree Buller Jr., male   DOB: Apr 01, 1970, 45 y.o.   MRN: [id number] Doing well, minimal incisional pain. Plan to go  Home in am

## 2015-06-24 NOTE — Op Note (Signed)
NAMAshley Clark:  Carrol, Mel[place]               ACCOUNT NO.:  [id number]  MEDICAL RECORD NO.:  [id number]  LOCATION:  5M12C                        FACILITY:  [facility]  PHYSICIAN:  Hilda LiasErnesto Gracielynn Birkel, M.D.   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  Morbid obesity.  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  Morbid obesity.  PROCEDURE:  Right L5 hemilaminectomy, decompression of the thecal sac. Foraminotomy to decompress the L4, L5, S1 nerve root in the right side. Microscope.  SURGEON:  Hilda LiasErnesto Tavien Chestnut, M.D.  ASSISTANT:  Kathaleen MaserHenry A. Pool, M.D.  CLINICAL HISTORY:  Mr. Hale Bogusorter is over a 420-pound gentleman who had been complaining of back pain radiation to the right leg.  He has failed conservative treatment.  MRI showed that he has a degenerative disk disease with stenosis at L4-5, 5-1.  He denies any pain whatsoever in the left side.  The patient has 420 pounds of weight.  The patient wants to proceed with surgery.  He knew the risks and benefits especially the ones associated with his obesity.  DESCRIPTION OF PROCEDURE:  The patient was taken to the OR, and after intubation, he was positioned in a prone manner.  The skin was cleaned with Betadine and DuraPrep.  Because of the size, we were unable to feel any bony structure.  The x-rays showed we were close to L4.  Then, a midline incision was made through the skin, subcutaneous tissue.  He was difficult to get to the midline because of the fact which keeps falling into the operative site.  Nevertheless, here we were able to feel any spinous process.  Incision was made.  Retraction was made in the right side.  By x-ray, we were right at the level of 4-5, 5-1.  After we introduced 2 retractors.  With the help of the microscope, we  started doing laminotomy of L4 and L5, but the area was quite hurt and we decided to go ahead with a hemilaminectomy of L5.  The patient has quite a bit of calcified yellow ligament.  At the level of L4-5, the foramen for the L5 nerve root was quite narrow.  This had adipose tissue and narrowing.  Decompression was done with the drill as well as the Kerrison punch.  Decompression of the L4 nerve root was also done at the same time. Then, we dropped down to the microscope to L5-S1 decompression of the S1 nerve root was achieved.  The area was irrigated.  Valsalva twice was negative.  Then, fentanyl, Depo-Medrol, and vancomycin was left on the operative side as well as a drain.  Then, the wound was closed with Vicryl and Steri-Strips.          ______________________________ Hilda LiasErnesto Alwin Lanigan, M.D.     EB/MEDQ  D:  06/23/2015  T:  06/24/2015  Job:  [id number]

## 2015-06-25 DIAGNOSIS — M5116 Intervertebral disc disorders with radiculopathy, lumbar region: Secondary | ICD-10-CM | POA: Diagnosis not present

## 2015-06-25 NOTE — Discharge Summary (Signed)
Physician Discharge Summary  Patient ID: Kenneth HeinrichParree Couts Jr. MRN: [id number] DOB/AGE: [birth year] 45 y.o.  Admit date: 06/23/2015 Discharge date: 06/25/2015  Admission Diagnoses:lumbar stnosis  Discharge Diagnoses:  Active Problems:   Lumbar stenosis   Discharged Condition: no pain  Hospital Course: surgery  Consults: none  Significant Diagnostic Studies: mri  Treatments: right lumbar decompression  Discharge Exam: Blood pressure 110/58, pulse 67, temperature 98.5 F (36.9 C), temperature source Oral, resp. rate 20, height 6\' 3"  (1.905 m), weight 187.336 kg (413 lb), SpO2 99 %. No weakness  Disposition: hom     Medication List    ASK your doctor about these medications        meloxicam 15 MG tablet  Commonly known as:  MOBIC  Take 15 mg by mouth daily.         Signed: Karn CassisBOTERO,Jarika Robben M 06/25/2015, 9:19 AM

## 2015-06-25 NOTE — Progress Notes (Signed)
Pt discharged at this time taking all personal belongings. Surgical dressing to back dry and intact. IV discontinued, dry dressing applied. Discharge instructions provided with verbal understanding. No equipment needs. No complaints of pain or discomfort.

## 2015-06-25 NOTE — Care Management Note (Signed)
Case Management Note  Patient Details  Name: Schawn Byas. MRN: [id number] Date of Birth: 08/30/1970  Subjective/Objective:                    Action/Plan: Pt discharging home with self care. OT recommended an 3 in 1. CM met with the patient and he does not feel he needs this equipment. No further needs per CM.   Expected Discharge Date:   (Pending)               Expected Discharge Plan:  Home/Self Care  In-House Referral:     Discharge planning Services  CM Consult  Post Acute Care Choice:    Choice offered to:     DME Arranged:    DME Agency:     HH Arranged:    [place] Agency:     Status of Service:  Completed, signed off  Medicare Important Message Given:    Date Medicare IM Given:    Medicare IM give by:    Date Additional Medicare IM Given:    Additional Medicare Important Message give by:     If discussed at [place] of Stay Meetings, dates discussed:    Additional Comments:  Pollie Friar, RN 06/25/2015, 10:41 AM

## 2018-03-10 IMAGING — CR DG LUMBAR SPINE 2-3V
1 series · 1 of 1 positions shown · non-contrast
Comparison: 05/21/2015

CLINICAL DATA: Portable lumbar spine imaging for surgical
localization.

EXAM:
LUMBAR SPINE - 2-3 VIEW

[lat]
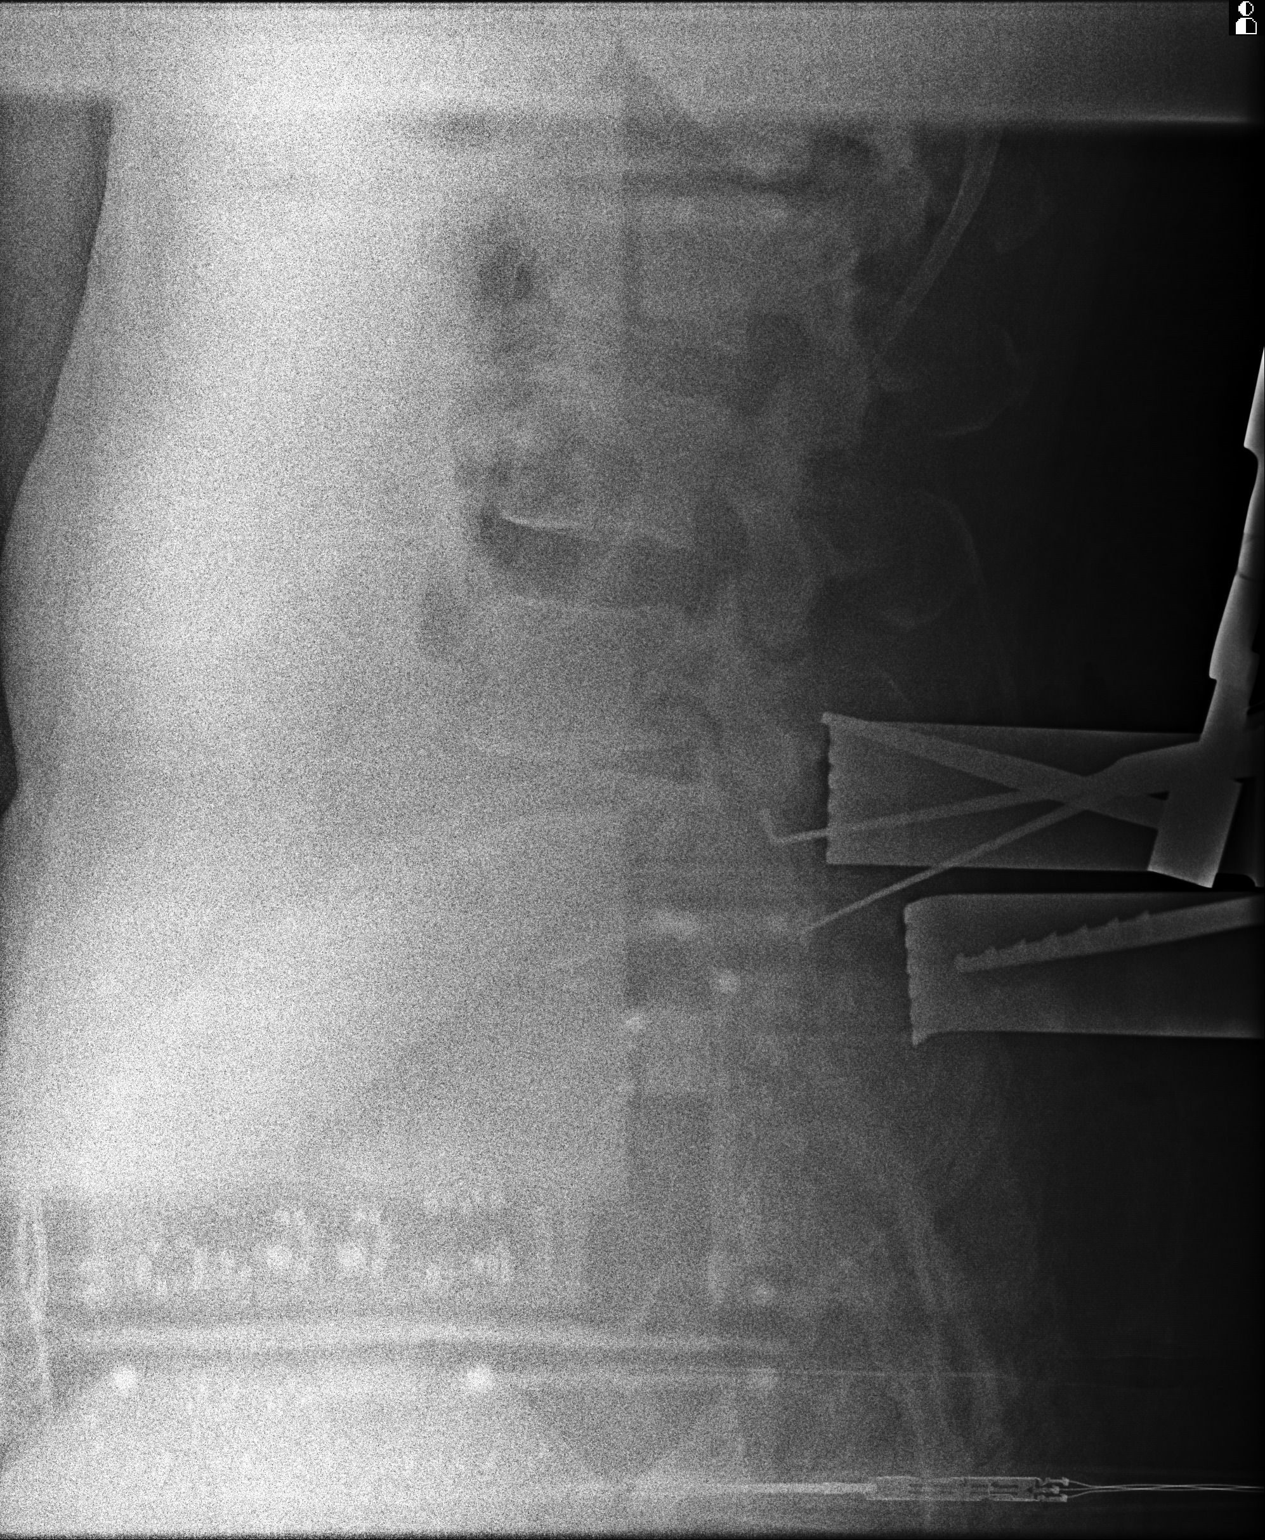

[1 of 1 positions shown; findings below may reference images not displayed]

FINDINGS: Imaging shows placement of surgical localization probes, initially a
needle lies 5 cm posterior to the posterior upper aspect of the L5
vertebrae. Subsequent imaging showing placement of surgical probes
posterior to the L4-L5 and L5-S1 discs.
IMPRESSION: Surgical localization imaging as described.

## 2018-12-12 ENCOUNTER — Other Ambulatory Visit: Payer: Self-pay

## 2018-12-12 DIAGNOSIS — Z20822 Contact with and (suspected) exposure to covid-19: Secondary | ICD-10-CM

## 2018-12-14 LAB — NOVEL CORONAVIRUS, NAA: SARS-CoV-2, NAA: NOT DETECTED

## 2023-06-20 ENCOUNTER — Encounter: Payer: Self-pay | Admitting: Family Medicine

## 2023-06-28 ENCOUNTER — Other Ambulatory Visit (HOSPITAL_COMMUNITY): Payer: Self-pay | Admitting: Family Medicine

## 2023-06-28 DIAGNOSIS — M25552 Pain in left hip: Secondary | ICD-10-CM

## 2023-07-05 ENCOUNTER — Ambulatory Visit (HOSPITAL_COMMUNITY)
Admission: RE | Admit: 2023-07-05 | Discharge: 2023-07-05 | Disposition: A | Discharge status: Home / Self Care | Source: Ambulatory Visit | Attending: Family Medicine | Admitting: Family Medicine

## 2023-07-05 DIAGNOSIS — M25552 Pain in left hip: Secondary | ICD-10-CM

## 2023-07-06 ENCOUNTER — Ambulatory Visit (HOSPITAL_COMMUNITY)
Admission: RE | Admit: 2023-07-06 | Discharge: 2023-07-06 | Disposition: A | Discharge status: Home / Self Care | Source: Ambulatory Visit | Attending: Family Medicine | Admitting: Family Medicine

## 2023-07-06 DIAGNOSIS — M25552 Pain in left hip: Secondary | ICD-10-CM | POA: Diagnosis present

## 2023-07-06 MED ORDER — BUPIVACAINE HCL (PF) 0.25 % IJ SOLN
INTRAMUSCULAR | Status: AC
  Filled 2023-07-06: qty 30

## 2023-07-06 MED ORDER — METHYLPREDNISOLONE ACETATE 40 MG/ML IJ SUSP
INTRAMUSCULAR | Status: AC
  Filled 2023-07-06: qty 1

## 2023-07-06 MED ORDER — BUPIVACAINE HCL (PF) 0.25 % IJ SOLN
5.0000 mL | Freq: Once | INTRAMUSCULAR | Status: AC
  Administered 2023-07-06: 5 mL via INTRA_ARTICULAR

## 2023-07-06 MED ORDER — METHYLPREDNISOLONE ACETATE 40 MG/ML INJ SUSP (RADIOLOG
40.0000 mg | Freq: Once | INTRAMUSCULAR | Status: AC
  Administered 2023-07-06: 40 mg via INTRA_ARTICULAR

## 2023-07-06 MED ORDER — IOHEXOL 180 MG/ML  SOLN
10.0000 mL | Freq: Once | INTRAMUSCULAR | Status: AC | PRN
  Administered 2023-07-06: 10 mL via INTRA_ARTICULAR

## 2023-07-06 MED ORDER — LIDOCAINE HCL (PF) 1 % IJ SOLN
5.0000 mL | Freq: Once | INTRAMUSCULAR | Status: AC
  Administered 2023-07-06: 5 mL via INTRADERMAL

## 2023-07-06 MED ORDER — IOHEXOL 300 MG/ML  SOLN
10.0000 mL | Freq: Once | INTRAMUSCULAR | Status: AC | PRN
  Administered 2023-07-06: 10 mL via INTRA_ARTICULAR

## 2024-01-16 ENCOUNTER — Encounter (HOSPITAL_COMMUNITY): Payer: Self-pay | Admitting: *Deleted

## 2024-01-16 ENCOUNTER — Ambulatory Visit (HOSPITAL_COMMUNITY)
Admission: EM | Admit: 2024-01-16 | Discharge: 2024-01-16 | Disposition: A | Discharge status: Home / Self Care | Payer: Self-pay | Attending: Emergency Medicine | Admitting: Emergency Medicine

## 2024-01-16 DIAGNOSIS — R600 Localized edema: Secondary | ICD-10-CM | POA: Diagnosis present

## 2024-01-16 LAB — COMPREHENSIVE METABOLIC PANEL WITH GFR
ALT: 62 U/L — ABNORMAL HIGH (ref 0–44)
AST: 61 U/L — ABNORMAL HIGH (ref 15–41)
Albumin: 3.5 g/dL (ref 3.5–5.0)
Alkaline Phosphatase: 103 U/L (ref 38–126)
Anion gap: 11 (ref 5–15)
BUN: <5 mg/dL — ABNORMAL LOW (ref 6–20)
CO2: 26 mmol/L (ref 22–32)
Calcium: 8.9 mg/dL (ref 8.9–10.3)
Chloride: 101 mmol/L (ref 98–111)
Creatinine, Ser: 0.76 mg/dL (ref 0.61–1.24)
GFR, Estimated: >60 mL/min
Glucose, Bld: 170 mg/dL — ABNORMAL HIGH (ref 70–99)
Potassium: 4.3 mmol/L (ref 3.5–5.1)
Sodium: 137 mmol/L (ref 135–145)
Total Bilirubin: 0.5 mg/dL (ref 0.0–1.2)
Total Protein: 6.9 g/dL (ref 6.5–8.1)

## 2024-01-16 LAB — CBC WITH DIFFERENTIAL/PLATELET
Abs Immature Granulocytes: 0.05 K/uL (ref 0.00–0.07)
Basophils Absolute: 0.1 K/uL (ref 0.0–0.1)
Basophils Relative: 1 %
Eosinophils Absolute: 0.2 K/uL (ref 0.0–0.5)
Eosinophils Relative: 2 %
HCT: 41.8 % (ref 39.0–52.0)
Hemoglobin: 13.7 g/dL (ref 13.0–17.0)
Immature Granulocytes: 1 %
Lymphocytes Relative: 16 %
Lymphs Abs: 1.2 K/uL (ref 0.7–4.0)
MCH: 25.9 pg — ABNORMAL LOW (ref 26.0–34.0)
MCHC: 32.8 g/dL (ref 30.0–36.0)
MCV: 79 fL — ABNORMAL LOW (ref 80.0–100.0)
Monocytes Absolute: 0.8 K/uL (ref 0.1–1.0)
Monocytes Relative: 11 %
Neutro Abs: 5.2 K/uL (ref 1.7–7.7)
Neutrophils Relative %: 69 %
Platelets: 215 K/uL (ref 150–400)
RBC: 5.29 MIL/uL (ref 4.22–5.81)
RDW: 14.1 % (ref 11.5–15.5)
WBC: 7.5 K/uL (ref 4.0–10.5)
nRBC: 0 % (ref 0.0–0.2)

## 2024-01-16 LAB — PRO BRAIN NATRIURETIC PEPTIDE: Pro Brain Natriuretic Peptide: 748 pg/mL — ABNORMAL HIGH

## 2024-01-16 MED ORDER — FUROSEMIDE 20 MG PO TABS: 20.0000 mg | ORAL_TABLET | Freq: Every day | ORAL | 0 refills | Status: DC | PRN

## 2024-01-16 MED ORDER — POTASSIUM CHLORIDE CRYS ER 10 MEQ PO TBCR: 10.0000 meq | EXTENDED_RELEASE_TABLET | Freq: Two times a day (BID) | ORAL | 0 refills | Status: AC

## 2024-01-16 NOTE — ED Triage Notes (Signed)
 Pt states he has had bilateral leg swelling since thanksgiving. He started wearing compression socks. His chiropractor advised him to be seen maybe a blood clot. He states he has a hard knot on his right calf. He states calf doesn't hurt but the right knee hurts. He has had to go up a shoe size and make it a wide since the swelling started.   Pt states his breathing has been off due to congestion he has been taking mucinex.

## 2024-01-16 NOTE — ED Provider Notes (Signed)
 " [facility]    CSN: [id number] Arrival date & time: 01/16/24  1707      History   Chief Complaint Chief Complaint  Patient presents with   Leg Swelling   Leg Pain   Nasal Congestion    HPI Kenneth Clark. is a 54 y.o. male.   Patient presents to clinic with his wife over concern of bilateral pitting edema for months.   Reports some swelling in legs before thanksgiving, had to go to wide shoes  Prior to thanksgiving wife noticed a dark spot to the back of the calf, this has been persistent and is not painful Has not had pain the back of the calf but having pain in the right knee, no injuries Does a lot of travelling so he does wear compression stockings, has been wearing them more often Chiropractor advised leg elevation up the wall and this helped   Reports some trouble breathing when he went up the stairs the other day  Chiropractor advised UC when he goes to UC Cough is present, has been coughing up clear mucus (cough x2 weeks) Denies trouble laying flat, when he lays flat he does cough more, denies SOB Is adopted but denies family hx of heart failure  Does not have a PCP    The history is provided by the patient and medical records.  Leg Pain   Past Medical History:  Diagnosis Date   Arthritis    back   Diabetes mellitus without complication (HCC)    Shortness of breath dyspnea    after running   Urgency of urination    when having a great deal of back pain    Patient Active Problem List   Diagnosis Date Noted   Lumbar stenosis 06/23/2015    Past Surgical History:  Procedure Laterality Date   GASTRIC BYPASS  2011   HERNIA REPAIR     KNEE ARTHROSCOPY Left    LUMBAR LAMINECTOMY/DECOMPRESSION MICRODISCECTOMY Right 06/23/2015   Procedure: Right Lumbar four-five Lumbar five-Sacral one Discectomy/Foraminotomy;  Surgeon: Catalina Stains, MD;  Location: [facility];  Service: Neurosurgery;  Laterality: Right;   ROTATOR CUFF REPAIR Left    WRIST  SURGERY Left    Removed IV catheter tip that was left after IV was removed       Home Medications    Prior to Admission medications  Medication Sig Start Date End Date Taking? Authorizing Provider  furosemide  (LASIX ) 20 MG tablet Take 1 tablet (20 mg total) by mouth daily as needed. 01/16/24  Yes Izan Miron  N, FNP  potassium chloride  (KLOR-CON  M) 10 MEQ tablet Take 1 tablet (10 mEq total) by mouth 2 (two) times daily. 01/16/24  Yes Cassidy Tabet  N, FNP  meloxicam (MOBIC) 15 MG tablet Take 15 mg by mouth daily.    [provider]    Family History History reviewed. No pertinent family history.  Social History Social History[1]   Allergies   Ace inhibitors   Review of Systems Review of Systems  Per HPI  Physical Exam Triage Vital Signs ED Triage Vitals  Encounter Vitals Group     BP 01/16/24 1831 (!) 144/83     Girls Systolic BP Percentile --      Girls Diastolic BP Percentile --      Boys Systolic BP Percentile --      Boys Diastolic BP Percentile --      Pulse Rate 01/16/24 1831 88     Resp 01/16/24 1831 18  Temp 01/16/24 1831 98.8 F (37.1 C)     Temp Source 01/16/24 1831 Oral     SpO2 01/16/24 1831 95 %     Weight --      Height --      Head Circumference --      Peak Flow --      Pain Score 01/16/24 1829 7     Pain Loc --      Pain Education --      Exclude from Growth Chart --    No data found.  Updated Vital Signs BP (!) 144/83 (BP Location: Left Arm)   Pulse 88   Temp 98.8 F (37.1 C) (Oral)   Resp 18   SpO2 95%   Visual Acuity Right Eye Distance:   Left Eye Distance:   Bilateral Distance:    Right Eye Near:   Left Eye Near:    Bilateral Near:     Physical Exam Vitals and nursing note reviewed.  Constitutional:      Appearance: Normal appearance.  HENT:     Head: Normocephalic and atraumatic.     Right Ear: External ear normal.     Left Ear: External ear normal.     Nose: Nose normal.     Mouth/Throat:      Mouth: Mucous membranes are moist.  Eyes:     Conjunctiva/sclera: Conjunctivae normal.  Cardiovascular:     Rate and Rhythm: Normal rate and regular rhythm.     Heart sounds: Normal heart sounds. No murmur heard. Musculoskeletal:     Right lower leg: 3+ Pitting Edema present.     Left lower leg: 3+ Pitting Edema present.  Skin:    General: Skin is warm and dry.     Capillary Refill: Capillary refill takes less than 2 seconds.  Neurological:     General: No focal deficit present.     Mental Status: He is alert.  Psychiatric:        Mood and Affect: Mood normal.        Behavior: Behavior is cooperative.      UC Treatments / Results  Labs (all labs ordered are listed, but only abnormal results are displayed) Labs Reviewed  COMPREHENSIVE METABOLIC PANEL WITH GFR  CBC WITH DIFFERENTIAL/PLATELET  PRO BRAIN NATRIURETIC PEPTIDE    EKG   Radiology No results found.  Procedures Procedures (including critical care time)  Medications Ordered in UC Medications - No data to display  Initial Impression / Assessment and Plan / UC Course  I have reviewed the triage vital signs and the nursing notes.  Pertinent labs & imaging results that were available during my care of the patient were reviewed by me and considered in my medical decision making (see chart for details).  Vitals and triage reviewed, patient is hemodynamically stable.  Lungs vesicular, heart with regular rate and rhythm.  Acute on chronic bilateral pitting edema.  Area of discoloration to the anterior and posterior right lower leg, appears chronic.  Outpatient ultrasound ordered to be completed tomorrow to ensure no DVT.  Negative Homans.  Will check CBC, CMP and BNP.  Will trial low-dose Lasix  with potassium supplementation to diurese.  Encourage PCP follow-up for further management.  Strict emergency precautions given if symptoms evolve or worsen.     Final Clinical Impressions(s) / UC Diagnoses   Final  diagnoses:  Edema of lower extremity     Discharge Instructions      We are checking some basic labs  will contact you for urgent follow-up as needed.  I am concerned that you may have heart failure.  Follow-up tomorrow for your lower extremity ultrasound.  Start the lasix  tomorrow, you will likely have to urinate a lot. Take the lasix  daily as needed for swelling. If you take the lasix  also take potassium supplementation as well. Continue to elevate and wear compression stockings.   Tomorrow please go to the [facility] (address [address] [place]) at 8 am and report to the 4th floor registration Zone A.  Inform registration that you are there for a vascular study.   Seek immediate care for new or urgent symptoms. Follow-up with a primary care in the next month or so.       ED Prescriptions     Medication Sig Dispense Auth. Provider   furosemide  (LASIX ) 20 MG tablet Take 1 tablet (20 mg total) by mouth daily as needed. 30 tablet Dreama, Baer Hinton  N, FNP   potassium chloride  (KLOR-CON  M) 10 MEQ tablet Take 1 tablet (10 mEq total) by mouth 2 (two) times daily. 60 tablet Dreama, Milka Windholz  N, FNP      PDMP not reviewed this encounter.     [1]  Social History Tobacco Use   Smoking status: Never  Vaping Use   Vaping status: Never Used  Substance Use Topics   Alcohol use: Yes    Comment: rare   Drug use: Yes     Dreama Hillis SAILOR, FNP 01/16/24 1919  "

## 2024-01-16 NOTE — Discharge Instructions (Addendum)
 We are checking some basic labs will contact you for urgent follow-up as needed.  I am concerned that you may have heart failure.  Follow-up tomorrow for your lower extremity ultrasound.  Start the lasix  tomorrow, you will likely have to urinate a lot. Take the lasix  daily as needed for swelling. If you take the lasix  also take potassium supplementation as well. Continue to elevate and wear compression stockings.   Tomorrow please go to the [facility] (address [address], [place]) at 8 am and report to the 4th floor registration Zone A.  Inform registration that you are there for a vascular study.   Seek immediate care for new or urgent symptoms. Follow-up with a primary care in the next month or so.

## 2024-01-17 ENCOUNTER — Ambulatory Visit (HOSPITAL_COMMUNITY)
Admission: RE | Admit: 2024-01-17 | Discharge: 2024-01-17 | Disposition: A | Discharge status: Home / Self Care | Payer: Self-pay | Source: Ambulatory Visit | Attending: Emergency Medicine | Admitting: Emergency Medicine

## 2024-01-17 DIAGNOSIS — M7989 Other specified soft tissue disorders: Secondary | ICD-10-CM

## 2024-01-17 DIAGNOSIS — R609 Edema, unspecified: Secondary | ICD-10-CM | POA: Diagnosis present

## 2024-01-18 ENCOUNTER — Ambulatory Visit (HOSPITAL_COMMUNITY): Payer: Self-pay

## 2024-01-18 ENCOUNTER — Ambulatory Visit (HOSPITAL_COMMUNITY): Payer: Self-pay | Admitting: Emergency Medicine

## 2024-01-26 ENCOUNTER — Encounter: Payer: Self-pay | Admitting: Family Medicine

## 2024-01-26 ENCOUNTER — Ambulatory Visit: Payer: Self-pay | Admitting: Family Medicine

## 2024-01-26 VITALS — BP 116/81 | HR 70 | Ht 75.0 in | Wt 395.0 lb

## 2024-01-26 DIAGNOSIS — I509 Heart failure, unspecified: Secondary | ICD-10-CM | POA: Diagnosis not present

## 2024-01-26 DIAGNOSIS — Z1159 Encounter for screening for other viral diseases: Secondary | ICD-10-CM | POA: Diagnosis not present

## 2024-01-26 DIAGNOSIS — Z7689 Persons encountering health services in other specified circumstances: Secondary | ICD-10-CM | POA: Diagnosis not present

## 2024-01-26 DIAGNOSIS — Z125 Encounter for screening for malignant neoplasm of prostate: Secondary | ICD-10-CM | POA: Diagnosis not present

## 2024-01-26 DIAGNOSIS — E118 Type 2 diabetes mellitus with unspecified complications: Secondary | ICD-10-CM

## 2024-01-26 MED ORDER — VALSARTAN 80 MG PO TABS: 80.0000 mg | ORAL_TABLET | Freq: Every day | ORAL | 3 refills | Status: AC

## 2024-01-26 MED ORDER — EMPAGLIFLOZIN 10 MG PO TABS: 10.0000 mg | ORAL_TABLET | Freq: Every day | ORAL | 2 refills | Status: AC

## 2024-01-26 NOTE — Assessment & Plan Note (Signed)
 SGLT2 inhibitors like Jardiance  beneficial for diabetes and heart failure. Discussed side effects. - Prescribed Jardiance . - Ordered A1c to assess diabetes control.

## 2024-01-26 NOTE — Progress Notes (Unsigned)
 "  Established Patient Office Visit  Subjective   Patient ID: Kenneth Clark., male    DOB: 03-Aug-1970  Age: 54 y.o. MRN: [id number]  Chief Complaint  Patient presents with   New Patient (Initial Visit)     History of Present Illness   Kenneth Clark. is a 54 year old male who presents with shortness of breath and leg swelling.  He reports intermittent shortness of breath and leg swelling that first occurred about two and a half years ago and recurred a few weeks ago. At urgent care he had a leg ultrasound and a BNP of 750. He was started on Lasix  and has had marked improvement in leg swelling since then.  He had gastric bypass in 2011 with weight loss from 549 to about 297 pounds, and back surgery 6 to 7 years ago that limited his activity and contributed to some weight regain. He has minimal back pain and occasional stiffness.  For the past 2.5 to 3 weeks he has had a productive cough with light nasal drainage. Mucus is not colored or foamy.  He had hives and flu-like symptoms with an ACE inhibitor in the past, without anaphylaxis. He does not use tobacco and drinks alcohol occasionally, with reduced intake since starting Lasix .          The ASCVD Risk score (Arnett DK, et al., 2019) failed to calculate for the following reasons:   Unable to determine if patient is Non-Hispanic African American   * - Cholesterol units were assumed  Health Maintenance Due  Topic Date Due   FOOT EXAM  Never done   OPHTHALMOLOGY EXAM  Never done   HIV Screening  Never done   Diabetic kidney evaluation - Urine ACR  Never done   Hepatitis B Vaccines 19-59 Average Risk (1 of 3 - 19+ 3-dose series) Never done   Colonoscopy  Never done   COVID-19 Vaccine (1 - 2025-26 season) Never done      Objective:     BP 116/81   Pulse 70   Ht 6' 3 (1.905 m)   Wt (!) 395 lb (179.2 kg)   SpO2 97%   BMI 49.37 kg/m    Physical Exam      Gen: alert, oriented obese male.  HEENT: perrla, eomi,  mmm CV: rrr, no murmur Pulm: lctab. No wheeze or crackles.  GI: soft, nbs.  Nontender to palpation. Large pannus MSK: strength equal b/l. Normal gait Ext: mild trace edema b/l Skin: warm and dry, no rashes Psych: pleasant affect.  Spontaneous speech      Assessment & Plan:   Encounter to establish care  Congestive heart failure, unspecified HF chronicity, unspecified heart failure type [facility]) Assessment & Plan: Elevated ProBNP suggests exacerbation. Previous ACE inhibitor reaction noted.. Discussed ARB cross-reactivity and SGLT2 inhibitors' benefits. Advised on alcohol intake. Will prescribe ARB w/ caution and advised pt on signs/sx of allergic reaction.  - Ordered echocardiogram to assess ejection fraction. - Prescribed valsartan  with allergy monitoring. - Prescribed Jardiance  with monitoring for UTIs - Referred to cardiologist. - Ordered labs: A1c, cholesterol, kidney and thyroid function. - Advised minimizing alcohol intake.  Orders: -     Basic metabolic panel with GFR -     Hemoglobin A1c -     ECHOCARDIOGRAM COMPLETE; Future -     Ambulatory referral to Cardiology -     TSH -     Lipid panel  Morbid obesity (HCC) Assessment & Plan: Discuss glp-1 and  nutritionist referral in upcoming visits  Orders: -     Basic metabolic panel with GFR -     Hemoglobin A1c -     TSH -     Lipid panel  Diabetes mellitus type 2, controlled, with complications (HCC) Assessment & Plan: SGLT2 inhibitors like Jardiance  beneficial for diabetes and heart failure. Discussed side effects. - Prescribed Jardiance . - Ordered A1c to assess diabetes control.  Orders: -     Basic metabolic panel with GFR -     Hemoglobin A1c -     TSH -     Lipid panel  Prostate cancer screening -     PSA  Encounter for hepatitis C screening test for low risk patient -     Hepatitis C antibody  Other orders -     Valsartan ; Take 1 tablet (80 mg total) by mouth daily.  Dispense: 90 tablet; Refill:  3 -     Empagliflozin ; Take 1 tablet (10 mg total) by mouth daily.  Dispense: 30 tablet; Refill: 2    I spent 50 min in the mgmt of this patinet.    Return in about 4 weeks (around 02/23/2024) for DM, hf.    [name], MD  "

## 2024-01-26 NOTE — Assessment & Plan Note (Signed)
 Elevated ProBNP suggests possible exacerbation. Previous ACE inhibitor reaction noted. Discussed ARB cross-reactivity and SGLT2 inhibitors' benefits. Advised on alcohol intake. - Ordered echocardiogram to assess ejection fraction. - Prescribed valsartan  with allergy monitoring. - Prescribed Jardiance  with monitoring for UTIs and rare infections. - Referred to cardiologist. - Ordered labs: A1c, cholesterol, kidney and thyroid function. - Advised minimizing alcohol intake.

## 2024-01-26 NOTE — Patient Instructions (Signed)
" °  VISIT SUMMARY: Today, we addressed your shortness of breath, leg swelling, diabetes, and chronic knee pain. We discussed your symptoms, medications, and made plans for further evaluation and management.  YOUR PLAN: HEART FAILURE: You have been experiencing shortness of breath and leg swelling, which may be related to heart failure. -We ordered an echocardiogram to assess your heart's ejection fraction. -We prescribed valsartan  and will monitor for any allergic reactions. -We prescribed Jardiance  and will monitor for urinary tract infections and other rare infections. -We referred you to a cardiologist for further evaluation. -We ordered blood tests to check your A1c, cholesterol, kidney, and thyroid function. -Please minimize your alcohol intake.  TYPE 2 DIABETES MELLITUS: Your diabetes management is important for your overall health and heart condition. -We prescribed Jardiance  to help manage your diabetes and heart failure. -We ordered an A1c test to assess your diabetes control.  CHRONIC RIGHT KNEE PAIN: You have chronic pain in your right knee, especially when walking long distances. -We provided a temporary handicap placard for six months. -We will reevaluate the need for the placard at your next visit.   "

## 2024-01-27 LAB — LIPID PANEL
Chol/HDL Ratio: 4.1 ratio (ref 0.0–5.0)
Cholesterol, Total: 217 mg/dL — ABNORMAL HIGH (ref 100–199)
HDL: 53 mg/dL
LDL Chol Calc (NIH): 141 mg/dL — ABNORMAL HIGH (ref 0–99)
Triglycerides: 126 mg/dL (ref 0–149)
VLDL Cholesterol Cal: 23 mg/dL (ref 5–40)

## 2024-01-27 LAB — HEPATITIS C ANTIBODY: Hep C Virus Ab: NONREACTIVE

## 2024-01-27 LAB — BASIC METABOLIC PANEL WITH GFR
BUN/Creatinine Ratio: 11 (ref 9–20)
BUN: 8 mg/dL (ref 6–24)
CO2: 20 mmol/L (ref 20–29)
Calcium: 9.5 mg/dL (ref 8.7–10.2)
Chloride: 101 mmol/L (ref 96–106)
Creatinine, Ser: 0.76 mg/dL (ref 0.76–1.27)
Glucose: 128 mg/dL — ABNORMAL HIGH (ref 70–99)
Potassium: 4.6 mmol/L (ref 3.5–5.2)
Sodium: 136 mmol/L (ref 134–144)
eGFR: 106 mL/min/1.73

## 2024-01-27 LAB — TSH: TSH: 2.05 u[IU]/mL (ref 0.450–4.500)

## 2024-01-27 LAB — HEMOGLOBIN A1C
Est. average glucose Bld gHb Est-mCnc: 192 mg/dL
Hgb A1c MFr Bld: 8.3 % — ABNORMAL HIGH (ref 4.8–5.6)

## 2024-01-27 LAB — PSA: Prostate Specific Ag, Serum: 2.5 ng/mL (ref 0.0–4.0)

## 2024-01-27 NOTE — Assessment & Plan Note (Signed)
 Discuss glp-1 and nutritionist referral in upcoming visits

## 2024-01-29 ENCOUNTER — Telehealth: Payer: Self-pay | Admitting: *Deleted

## 2024-01-29 ENCOUNTER — Ambulatory Visit: Payer: Self-pay

## 2024-01-29 ENCOUNTER — Other Ambulatory Visit (HOSPITAL_COMMUNITY): Payer: Self-pay

## 2024-01-29 ENCOUNTER — Telehealth: Payer: Self-pay

## 2024-01-29 ENCOUNTER — Other Ambulatory Visit: Payer: Self-pay

## 2024-01-29 NOTE — Telephone Encounter (Signed)
 LVM for pt to upload a copy of his insurance card so that we can get a PA for any imaging or referrals that have been placed.

## 2024-01-29 NOTE — Telephone Encounter (Signed)
 Copied from CRM #[id number]. Topic: Clinical - Prescription Issue >> Jan 29, 2024  3:36 PM Delon DASEN wrote: Reason for CRM: empagliflozin  (JARDIANCE ) 10 MG TABS tablet- pharmacy states that insurance does not want to cover, they are suggesting other meds-  [phone]

## 2024-01-29 NOTE — Telephone Encounter (Signed)
 Pharmacy Patient Advocate Encounter   Received notification from [facility] KEY that prior authorization for Jardiance  10 tabs is required/requested.   Insurance verification completed.   The patient is insured through [facility].   Per test claim: PA required; PA submitted to above mentioned insurance via Latent Key/confirmation #/EOC [id number] Status is pending

## 2024-01-29 NOTE — Telephone Encounter (Signed)
 Just ran a test claim and his insurance will cover Jardiance  for $0/month, it is just requiring a prior auth that looks like it was started on 01/26/23.   Tell patient to give it a few more days while we wait on approval of prior auth.  Can we reach out to rx team to confirm PA has been submitted?

## 2024-01-30 ENCOUNTER — Telehealth: Payer: Self-pay

## 2024-01-30 NOTE — Telephone Encounter (Signed)
 Copied from CRM #[id number]. Topic: General - Other >> Jan 29, 2024  3:35 PM Delon DASEN wrote: Reason for CRM: didn't receive form for handicap placard- [phone]

## 2024-01-30 NOTE — Telephone Encounter (Signed)
 Pt will pick up placard today. Saddie signed off on it.

## 2024-01-30 NOTE — Telephone Encounter (Signed)
 Called pt he is advised of the PA and the recommendation

## 2024-02-06 ENCOUNTER — Other Ambulatory Visit: Payer: Self-pay

## 2024-02-06 ENCOUNTER — Other Ambulatory Visit (HOSPITAL_COMMUNITY): Payer: Self-pay

## 2024-02-06 ENCOUNTER — Encounter: Payer: Self-pay | Admitting: Student in an Organized Health Care Education/Training Program

## 2024-02-06 ENCOUNTER — Ambulatory Visit: Admitting: Student in an Organized Health Care Education/Training Program

## 2024-02-06 ENCOUNTER — Ambulatory Visit
Admission: RE | Admit: 2024-02-06 | Discharge: 2024-02-06 | Disposition: A | Discharge status: Home / Self Care | Source: Ambulatory Visit | Attending: Student in an Organized Health Care Education/Training Program | Admitting: Student in an Organized Health Care Education/Training Program

## 2024-02-06 VITALS — BP 126/72 | HR 83 | Ht 75.0 in | Wt 393.2 lb

## 2024-02-06 DIAGNOSIS — I509 Heart failure, unspecified: Secondary | ICD-10-CM | POA: Insufficient documentation

## 2024-02-06 DIAGNOSIS — E782 Mixed hyperlipidemia: Secondary | ICD-10-CM

## 2024-02-06 LAB — ECHOCARDIOGRAM COMPLETE
Area-P 1/2: 3.02 cm2
Height: 75 in
S' Lateral: 3.7 cm
Weight: 6291.2 [oz_av]

## 2024-02-06 MED ORDER — FUROSEMIDE 20 MG PO TABS
20.0000 mg | ORAL_TABLET | Freq: Every day | ORAL | 3 refills | Status: AC
  Filled 2024-02-06: qty 30, 30d supply, fill #0

## 2024-02-06 MED ORDER — FUROSEMIDE 20 MG PO TABS: 20.0000 mg | ORAL_TABLET | Freq: Every day | ORAL | 3 refills | Status: DC

## 2024-02-06 MED ORDER — ROSUVASTATIN CALCIUM 20 MG PO TABS: 20.0000 mg | ORAL_TABLET | Freq: Every day | ORAL | 3 refills | Status: DC

## 2024-02-06 MED ORDER — ROSUVASTATIN CALCIUM 20 MG PO TABS
20.0000 mg | ORAL_TABLET | Freq: Every day | ORAL | 3 refills | Status: AC
  Filled 2024-02-06 (×2): qty 30, 30d supply, fill #0

## 2024-02-06 NOTE — Patient Instructions (Signed)
 Medication Instructions:  CHANGE Lasix  to 20 mg daily  START Crestor  20 mg daily  *If you need a refill on your cardiac medications before your next appointment, please call your pharmacy*  Lab Work: Lipid panel  LP(a) CMP  Magnesium   If you have labs (blood work) drawn today and your tests are completely normal, you will receive your results only by: MyChart Message (if you have MyChart) OR A paper copy in the mail If you have any lab test that is abnormal or we need to change your treatment, we will call you to review the results.  Testing/Procedures: Echocardiogram   Your physician has requested that you have an echocardiogram. Echocardiography is a painless test that uses sound waves to create images of your heart. It provides your doctor with information about the size and shape of your heart and how well your hearts chambers and valves are working. This procedure takes approximately one hour. There are no restrictions for this procedure. Please do NOT wear cologne, perfume, aftershave, or lotions (deodorant is allowed). Please arrive 15 minutes prior to your appointment time.  Please note: We ask at that you not bring children with you during ultrasound (echo/ vascular) testing. Due to room size and safety concerns, children are not allowed in the ultrasound rooms during exams. Our front office staff cannot provide observation of children in our lobby area while testing is being conducted. An adult accompanying a patient to their appointment will only be allowed in the ultrasound room at the discretion of the ultrasound technician under special circumstances. We apologize for any inconvenience.   Follow-Up: At [facility], you and your health needs are our priority.  As part of our continuing mission to provide you with exceptional heart care, our providers are all part of one team.  This team includes your primary Cardiologist (physician) and Advanced Practice Providers  or APPs (Physician Assistants and Nurse Practitioners) who all work together to provide you with the care you need, when you need it.  Your next appointment:   3 month(s)  Provider:   Georganna Archer, MD

## 2024-02-06 NOTE — Progress Notes (Signed)
 " Cardiology Office Note:  .   Date:  02/06/2024  ID:  Kenneth Clark., DOB Nov 13, 1970, MRN [id number] PCP: Chandra Toribio POUR, MD  [facility] Providers Cardiologist:  Georganna Archer, MD { Chief Complaint:  Chief Complaint  Patient presents with   Congestive Heart Failure    History of Present Illness: .    Kenneth Clark. is a 54 y.o. male with a PMH of morbid obesity s/p gastric bypass (2011), HLD and DM2 who presents as a new patient referral for the evaluation of CHF.  Discussed the use of AI scribe software for clinical note transcription with the patient, who gave verbal consent to proceed.  History of Present Illness Kenneth Clark. is a 54 year old male who presents with shortness of breath and leg swelling.  He has been experiencing shortness of breath and leg swelling for approximately two years, initially noticed during a trip to [facility]. At that time, he had significant shortness of breath and difficulty walking short distances, which he initially attributed to environmental factors and previous lower extremity injuries. The leg swelling has progressively worsened, requiring a change from regular to wide shoes.  He denies PND and apnea.  He also experiences episodes of nasal congestion and a non-productive cough. Lasix , started last Thursday, has significantly reduced the swelling and improved his breathing, although he experienced a recurrence of shortness of breath with cold weather exposure.  He has a history of weight fluctuations, having undergone weight loss surgery in 2011, and currently weighs approximately 375-380 pounds. His clothes have been fitting tighter, particularly in the lower extremities. No smoking or illicit drug use. He reports infrequent alcohol consumption, which has decreased since starting his current medications. He is unsure of the criteria for 'as needed' use of Lasix  and has been taking it daily.  He has no known family medical  history due to being adopted and has no biological children.    Studies Reviewed: SABRA    EKG:  EKG Interpretation Date/Time:  Tuesday February 06 2024 10:54:22 EST Ventricular Rate:  90 PR Interval:  176 QRS Duration:  90 QT Interval:  354 QTC Calculation: 433 R Axis:   97  Text Interpretation: Normal sinus rhythm Rightward axis Nonspecific ST abnormality No previous ECGs available Confirmed by Archer Georganna [phone]) on 02/06/2024 10:56:33 AM         Results Labs BNP: Elevated. LDL: 141.  Risk Assessment/Calculations:               Physical Exam:    VS:  BP 126/72 (BP Location: Left Arm, Patient Position: Sitting, Cuff Size: Large)   Pulse 83   Ht 6' 3 (1.905 m)   Wt (!) 393 lb 3.2 oz (178.4 kg)   SpO2 96%   BMI 49.15 kg/m      Wt Readings from Last 3 Encounters:  01/26/24 (!) 395 lb (179.2 kg)  06/23/15 (!) 413 lb (187.3 kg)  06/15/15 (!) 413 lb 11.2 oz (187.7 kg)     GEN: Well nourished, well developed, in no acute distress NECK: No JVD; No carotid bruits CARDIAC: RRR, no murmurs, rubs, gallops RESPIRATORY:  Clear to auscultation without rales, wheezing or rhonchi  ABDOMEN: Soft, non-tender, non-distended, normal bowel sounds EXTREMITIES:  Warm and well perfused, 1+ pitting edema bilaterally; No deformity, 2+ radial pulses PSYCH: Normal mood and affect   ASSESSMENT AND PLAN: .     #CHF - Patient presents for the evaluation of clinical CHF. -  This clinical syndrome is consistent with a diagnosis of CHF including his DOE, peripheral edema, and elevated BNP (which is likely underestimated in the setting of morbid obesity). - It's unknown at this time whether or not he has systolic and/or diastolic dysfunction, so we will need an echocardiogram for evaluation.  We were able to move up his echocardiogram to today. - Additional treatment/evaluation pending the results of his echocardiogram. -Lastly, he is noted marked improvement in his overall fluid status  by taking his Lasix  daily instead of as needed.  He still has some residual peripheral edema, so we will continue daily Lasix . Complete echo Start taking Lasix  20 mg daily Continue empagliflozin  10 mg daily On valsartan  for CHF but the utility is TBD Follow-up in 3 months but will likely be sooner depending on echo results   #HLD -Last LDL in the 140s which is not at his goal of <70. - Will start rosuvastatin  and the patient is agreeable. Start Crestor  20 mg daily Lipid panel, LPA in 3 months  #Morbid Obesity s/p Gastric Bypass - I think the patient would benefit from a GLP-1. - Will first await the results of his echocardiogram to see if he needs additional CHF meds as I do not want to overwhelm him with too many new medication changes. - Will likely refer him to Pharm.D. in the future. Consider Pharm.D. referral for GLP-1 in the future          This note was written with the assistance of a dictation microphone or AI dictation software. Please excuse any typos or grammatical errors.   Signed, Georganna Archer, MD  02/06/2024 10:47 AM    [facility] "

## 2024-02-06 NOTE — Progress Notes (Incomplete)
" °  Cardiology Office Note:  .   Date:  02/06/2024  ID:  Kenneth Fayette Raddle., DOB [birth year], MRN [id number] PCP: Chandra Toribio POUR, MD  [facility] Providers Cardiologist:  None { Chief Complaint: No chief complaint on file.   History of Present Illness: .    Kenneth Luisenrique Conran. is a 54 y.o. male with a PMH of morbid obesity s/p gastric bypass (2011), HLD and DM2 who presents as a new patient referral for the evaluation of CHF.   Pre-Chart Notes:   Discussed the use of AI scribe software for clinical note transcription with the patient, who gave verbal consent to proceed.  History of Present Illness       Studies Reviewed: SABRA    EKG: ***           Results   Risk Assessment/Calculations:    {Does this patient have ATRIAL FIBRILLATION?:[id number]} No BP recorded.  {Refresh Note OR Click here to enter BP  :1}***        Physical Exam:    VS:  There were no vitals taken for this visit. ***    Wt Readings from Last 3 Encounters:  01/26/24 (!) 395 lb (179.2 kg)  06/23/15 (!) 413 lb (187.3 kg)  06/15/15 (!) 413 lb 11.2 oz (187.7 kg)     GEN: Well nourished, well developed, in no acute distress NECK: No JVD; No carotid bruits CARDIAC: ***RRR, no murmurs, rubs, gallops RESPIRATORY:  Clear to auscultation without rales, wheezing or rhonchi  ABDOMEN: Soft, non-tender, non-distended, normal bowel sounds EXTREMITIES:  Warm and well perfused, no edema; No deformity, 2+ radial pulses PSYCH: Normal mood and affect   ASSESSMENT AND PLAN: .     #CHF   #HLD Start Crestor  20 mg daily       {Are you ordering a CV Procedure (e.g. stress test, cath, DCCV, TEE, etc)?   Press F2        :[id number]}   This note was written with the assistance of a dictation microphone or AI dictation software. Please excuse any typos or grammatical errors.   Signed, Georganna Archer, MD  02/06/2024 9:32 AM    [facility] "

## 2024-02-06 NOTE — Progress Notes (Signed)
 Order placed per verbal Dr. Floretta

## 2024-02-07 ENCOUNTER — Ambulatory Visit: Payer: Self-pay | Admitting: Student in an Organized Health Care Education/Training Program

## 2024-02-07 DIAGNOSIS — E782 Mixed hyperlipidemia: Secondary | ICD-10-CM

## 2024-02-07 LAB — LIPID PANEL
Chol/HDL Ratio: 4 ratio (ref 0.0–5.0)
Cholesterol, Total: 230 mg/dL — ABNORMAL HIGH (ref 100–199)
HDL: 57 mg/dL
LDL Chol Calc (NIH): 138 mg/dL — ABNORMAL HIGH (ref 0–99)
Triglycerides: 196 mg/dL — ABNORMAL HIGH (ref 0–149)
VLDL Cholesterol Cal: 35 mg/dL (ref 5–40)

## 2024-02-07 LAB — COMPREHENSIVE METABOLIC PANEL WITH GFR
ALT: 54 [IU]/L — ABNORMAL HIGH (ref 0–44)
AST: 71 [IU]/L — ABNORMAL HIGH (ref 0–40)
Albumin: 4 g/dL (ref 3.8–4.9)
Alkaline Phosphatase: 109 [IU]/L (ref 47–123)
BUN/Creatinine Ratio: 16 (ref 9–20)
BUN: 12 mg/dL (ref 6–24)
Bilirubin Total: 0.4 mg/dL (ref 0.0–1.2)
CO2: 17 mmol/L — ABNORMAL LOW (ref 20–29)
Calcium: 9.4 mg/dL (ref 8.7–10.2)
Chloride: 105 mmol/L (ref 96–106)
Creatinine, Ser: 0.76 mg/dL (ref 0.76–1.27)
Globulin, Total: 3.1 g/dL (ref 1.5–4.5)
Glucose: 133 mg/dL — ABNORMAL HIGH (ref 70–99)
Potassium: 4.5 mmol/L (ref 3.5–5.2)
Sodium: 140 mmol/L (ref 134–144)
Total Protein: 7.1 g/dL (ref 6.0–8.5)
eGFR: 106 mL/min/{1.73_m2}

## 2024-02-07 LAB — LIPOPROTEIN A (LPA): Lipoprotein (a): 212.5 nmol/L — AB

## 2024-02-07 LAB — MAGNESIUM: Magnesium: 2.3 mg/dL (ref 1.6–2.3)

## 2024-02-07 NOTE — Telephone Encounter (Signed)
"  Pt returning call about results.  Please advise.  "

## 2024-02-08 ENCOUNTER — Encounter: Payer: Self-pay | Admitting: Student in an Organized Health Care Education/Training Program

## 2024-02-08 DIAGNOSIS — R748 Abnormal levels of other serum enzymes: Secondary | ICD-10-CM

## 2024-02-09 ENCOUNTER — Other Ambulatory Visit (HOSPITAL_COMMUNITY): Payer: Self-pay

## 2024-02-09 NOTE — Telephone Encounter (Signed)
 Pharmacy Patient Advocate Encounter  Received notification from [facility] that Prior Authorization for Jardiance  10 has been APPROVED from 01/29/24 to 01/28/25. Unable to obtain price due to refill too soon rejection, last fill date 01/31/24 next available fill date2/13/26   PA #/Case ID/Reference #: # [id number]

## 2024-02-09 NOTE — Telephone Encounter (Signed)
 Order for liver doppler placed for elevated liver enzymes.

## 2024-02-12 NOTE — Telephone Encounter (Signed)
 LVM informing pt of below.

## 2024-02-15 ENCOUNTER — Ambulatory Visit (HOSPITAL_COMMUNITY)
Admission: RE | Admit: 2024-02-15 | Discharge: 2024-02-15 | Attending: Student in an Organized Health Care Education/Training Program

## 2024-02-15 DIAGNOSIS — R748 Abnormal levels of other serum enzymes: Secondary | ICD-10-CM

## 2024-02-16 NOTE — Telephone Encounter (Signed)
 Called patient today to confirm that he got the voicemail. Patient states that he was able to pick up the medication two weeks ago and the next refill is as stated below 02/23/24. AL

## 2024-02-28 ENCOUNTER — Ambulatory Visit: Admitting: Family Medicine

## 2024-03-06 ENCOUNTER — Ambulatory Visit (HOSPITAL_COMMUNITY)

## 2024-05-03 ENCOUNTER — Ambulatory Visit: Admitting: Student in an Organized Health Care Education/Training Program
